# Patient Record
Sex: Female | Born: 1990 | Race: White | Hispanic: No | Marital: Single | Smoking: Never smoker
Health system: Southern US, Community
[De-identification: ages and names within clinical notes are randomized; demographics above are authoritative.]

## PROBLEM LIST (undated history)

## (undated) ENCOUNTER — Inpatient Hospital Stay (HOSPITAL_COMMUNITY): Payer: Self-pay

## (undated) DIAGNOSIS — B977 Papillomavirus as the cause of diseases classified elsewhere: Secondary | ICD-10-CM

## (undated) DIAGNOSIS — R87629 Unspecified abnormal cytological findings in specimens from vagina: Secondary | ICD-10-CM

## (undated) DIAGNOSIS — R87619 Unspecified abnormal cytological findings in specimens from cervix uteri: Secondary | ICD-10-CM

## (undated) DIAGNOSIS — F419 Anxiety disorder, unspecified: Secondary | ICD-10-CM

## (undated) HISTORY — DX: Papillomavirus as the cause of diseases classified elsewhere: B97.7

## (undated) HISTORY — DX: Unspecified abnormal cytological findings in specimens from cervix uteri: R87.619

## (undated) HISTORY — PX: WISDOM TOOTH EXTRACTION: SHX21

## (undated) HISTORY — DX: Unspecified abnormal cytological findings in specimens from vagina: R87.629

---
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
## 2012-06-06 ENCOUNTER — Encounter: Payer: Self-pay | Admitting: Internal Medicine

## 2012-06-06 ENCOUNTER — Ambulatory Visit (INDEPENDENT_AMBULATORY_CARE_PROVIDER_SITE_OTHER): Payer: BC Managed Care – HMO | Admitting: Internal Medicine

## 2012-06-06 VITALS — BP 110/80 | HR 96 | Temp 98.7°F | Resp 14 | Ht 64.5 in | Wt 124.2 lb

## 2012-06-06 DIAGNOSIS — Z832 Family history of diseases of the blood and blood-forming organs and certain disorders involving the immune mechanism: Secondary | ICD-10-CM

## 2012-06-06 DIAGNOSIS — Z0000 Encounter for general adult medical examination without abnormal findings: Secondary | ICD-10-CM

## 2012-06-06 MED ORDER — DESOGESTREL-ETHINYL ESTRADIOL 0.15-30 MG-MCG PO TABS: 1.0000 | ORAL_TABLET | Freq: Every day | ORAL | Status: DC

## 2012-06-06 NOTE — Progress Notes (Addendum)
Patient ID: Tiffany Bowman, female   DOB: Feb 23, 1991, 21 y.o.   MRN: [id number] Patient Active Problem List  Diagnosis  . Routine general medical examination at a health care facility    Subjective:  CC:   Chief Complaint  Patient presents with  . New Patient    HPI:   Tiffany Bowman is a 21 y.o. female who presents as a new patient to establish primary care with the chief complaint of  New patient.  upcoming sophomore at [employer] .  getting along famously with roommate.  Biggest issue was time management.  Getting lots of rest this summer .  Had the flu after [employer] break.  History of tonsillitis  But only 2 episodes this year.  On birth control,  For regulation of periods,   Not sexually active.  Had Gardasil vaccine.  Needs refill.     History reviewed. No pertinent past medical history.  History reviewed. No pertinent past surgical history.  Family History  Problem Relation Age of Onset  . Glaucoma Father   . Cancer Paternal Grandmother     breast cancer    History   Social History  . Marital Status: Single    Spouse Name: N/A    Number of Children: N/A  . Years of Education: N/A   Occupational History  . Archivist.   Social History Main Topics  . Smoking status: Never Smoker   . Smokeless tobacco: Never Used  . Alcohol Use: Yes  . Drug Use: No  . Sexually Active: No   Other Topics Concern  . Not on file   Social History Narrative  . [employer] upcoming sophomore in Pre Occupational Therapy         @ALLHX @    Review of Systems:   The remainder of the review of systems was negative except those addressed in the HPI.       Objective:  BP 110/80  Pulse 96  Temp 98.7 F (37.1 C) (Oral)  Resp 14  Ht 5' 4.5" (1.638 m)  Wt 124 lb 4 oz (56.359 kg)  BMI 21.00 kg/m2  SpO2 96%  LMP 05/23/2012  General appearance: alert, cooperative and appears stated age Ears: normal TM's and external ear canals both ears Throat: lips, mucosa, and  tongue normal; teeth and gums normal Neck: no adenopathy, no carotid bruit, supple, symmetrical, trachea midline and thyroid not enlarged, symmetric, no tenderness/mass/nodules Back: symmetric, no curvature. ROM normal. No CVA tenderness. Lungs: clear to auscultation bilaterally Heart: regular rate and rhythm, S1, S2 normal, no murmur, click, rub or gallop Abdomen: soft, non-tender; bowel sounds normal; no masses,  no organomegaly Pulses: 2+ and symmetric Skin: Skin color, texture, turgor normal. No rashes or lesions Lymph nodes: Cervical, supraclavicular, and axillary nodes normal.  Assessment and Plan:  No problem-specific assessment & plan notes found for this encounter.   Updated Medication List Outpatient Encounter Prescriptions as of 06/06/2012  Medication Sig Dispense Refill  . desogestrel-ethinyl estradiol (APRI,EMOQUETTE,SOLIA) 0.15-30 MG-MCG tablet Take 1 tablet by mouth daily.  1 Package  2  . Multiple Vitamin (MULTIVITAMIN) tablet Take 1 tablet by mouth daily.      Marland Kitchen DISCONTD: desogestrel-ethinyl estradiol (APRI,EMOQUETTE,SOLIA) 0.15-30 MG-MCG tablet Take 1 tablet by mouth daily.         No orders of the defined types were placed in this encounter.    No Follow-up on file.

## 2012-06-06 NOTE — Patient Instructions (Addendum)
Get your eyes checked

## 2012-06-16 ENCOUNTER — Telehealth: Payer: Self-pay | Admitting: Internal Medicine

## 2012-06-16 NOTE — Addendum Note (Signed)
Addended by: Duncan Dull on: 06/16/2012 01:16 PM   Modules accepted: Orders

## 2012-06-16 NOTE — Telephone Encounter (Signed)
Ordered

## 2012-06-16 NOTE — Telephone Encounter (Signed)
Patient was advised that she could come in for labs today, she says that she doesn't want to come in for the labs. She says that her mom was the one that was wanting this done, but patient refuses to do this right now. Mom wants me to call patient and express the importance of having this checked, but patient is 80 and we can not force patient to come in for labs if she doesn't want to.

## 2012-06-16 NOTE — Telephone Encounter (Signed)
Mother wants her checked for Von Willebrands' disease. He other daughter was diagnosed at her last visit with Dr. Dan Humphreys. Jasara will be leaving for College tomorrow and the mother wants her checked today.

## 2012-07-20 ENCOUNTER — Ambulatory Visit (INDEPENDENT_AMBULATORY_CARE_PROVIDER_SITE_OTHER): Payer: BC Managed Care – HMO | Admitting: Internal Medicine

## 2012-07-20 ENCOUNTER — Encounter: Payer: Self-pay | Admitting: Internal Medicine

## 2012-07-20 VITALS — BP 110/72 | HR 100 | Temp 98.1°F | Ht 64.5 in | Wt 125.5 lb

## 2012-07-20 DIAGNOSIS — Z832 Family history of diseases of the blood and blood-forming organs and certain disorders involving the immune mechanism: Secondary | ICD-10-CM

## 2012-07-20 DIAGNOSIS — S300XXA Contusion of lower back and pelvis, initial encounter: Secondary | ICD-10-CM

## 2012-07-20 DIAGNOSIS — M533 Sacrococcygeal disorders, not elsewhere classified: Secondary | ICD-10-CM

## 2012-07-20 MED ORDER — LEVONORGEST-ETH ESTRAD 91-DAY 0.15-0.03 &0.01 MG PO TABS: 1.0000 | ORAL_TABLET | Freq: Every day | ORAL | Status: DC

## 2012-07-20 MED ORDER — CELECOXIB 200 MG PO CAPS: 200.0000 mg | ORAL_CAPSULE | Freq: Two times a day (BID) | ORAL | Status: DC

## 2012-07-20 NOTE — Patient Instructions (Addendum)
I am presciribing celebrex for your tailbone,  You may take it once or twice daily and add tylenol to it up to 4 daily.   X rays of tailbone ordered.   Seasonique sent to [facility] in [place]

## 2012-07-20 NOTE — Progress Notes (Signed)
Patient ID: Tiffany Bowman, female   DOB: 05-May-1991, 21 y.o.   MRN: [id number] Patient Active Problem List  Diagnosis  . Routine general medical examination at a health care facility  . Coccyx contusion    Subjective:  CC:   Chief Complaint  Patient presents with  . Tailbone Pain    HPI:   Tiffany Bowman a 21 y.o. female who presents Coccygeal pain. The pain occurred after a fall. Patient fell over a month ago while engaging in recreational activity at [employer].  She landed square on her bottom.  she states that she had no immediate pain and spent the next 2 hours in recreational activities sliding down the rocks into the water. The next day she noticed some pain in her tailbone area brought on by standing up and sitting down. The pain is aggravated by changing position. It is worse when she first stands up. It is rated as a 6 or 7/10. She denies any numbness or tingling to her legs or sacrum. She has no history of bruises noted in that area. She has not taken any medication for it.    No past medical history on file.  No past surgical history on file.       The following portions of the patient's history were reviewed and updated as appropriate: Allergies, current medications, and problem list.    Review of Systems:   12 Pt  review of systems was negative except those addressed in the HPI,     History   Social History  . Marital Status: Single    Spouse Name: N/A    Number of Children: N/A  . Years of Education: N/A   Occupational History  . Not on file.   Social History Main Topics  . Smoking status: Never Smoker   . Smokeless tobacco: Never Used  . Alcohol Use: Yes  . Drug Use: No  . Sexually Active: Not on file   Other Topics Concern  . Not on file   Social History Narrative  . No narrative on file    Objective:  BP 110/72  Pulse 100  Temp 98.1 F (36.7 C) (Oral)  Ht 5' 4.5" (1.638 m)  Wt 125 lb 8 oz (56.926 kg)  BMI 21.21 kg/m2   SpO2 99%  LMP 07/13/2012  General appearance: alert, cooperative and appears stated age Ears: normal TM's and external ear canals both ears Throat: lips, mucosa, and tongue normal; teeth and gums normal Neck: no adenopathy, no carotid bruit, supple, symmetrical, trachea midline and thyroid not enlarged, symmetric, no tenderness/mass/nodules Back: symmetric, no curvature. ROM normal. No CVA tenderness. MSK: Hips, normal ROM,  No ischial tuberosity tenderness,. Lungs: clear to auscultation bilaterally Heart: regular rate and rhythm, S1, S2 normal, no murmur, click, rub or gallop Abdomen: soft, non-tender; bowel sounds normal; no masses,  no organomegaly Pulses: 2+ and symmetric Skin: Skin color, texture, turgor normal. No rashes or lesions Lymph nodes: Cervical, supraclavicular, and axillary nodes normal.  Assessment and Plan:  Coccyx contusion Suggested by history of fall with recurrent blunt trauma to the tailbone on during recreational activities it sliding rock. Plain films ordered. Nonsteroidal anti-inflammatories and pain relievers prescribed. Her symptoms have been present for a month but have not been treated. I explained to her this can be very difficult area to heal and may require an orthopedic evaluation for a cortisone or lidocaine injection. We will try conservative therapy first.    Updated Medication List Outpatient Encounter Prescriptions as  of 07/20/2012  Medication Sig Dispense Refill  . Multiple Vitamin (MULTIVITAMIN) tablet Take 1 tablet by mouth daily.      Marland Kitchen DISCONTD: desogestrel-ethinyl estradiol (APRI,EMOQUETTE,SOLIA) 0.15-30 MG-MCG tablet Take 1 tablet by mouth daily.  1 Package  2  . celecoxib (CELEBREX) 200 MG capsule Take 1 capsule (200 mg total) by mouth 2 (two) times daily.  30 capsule  2  . Levonorgestrel-Ethinyl Estradiol (AMETHIA,CAMRESE) 0.15-0.03 &0.01 MG tablet Take 1 tablet by mouth daily.  1 Package  11     Orders Placed This Encounter  Procedures    . Von Willebrand panel    No Follow-up on file.

## 2012-07-21 DIAGNOSIS — S300XXA Contusion of lower back and pelvis, initial encounter: Secondary | ICD-10-CM | POA: Insufficient documentation

## 2012-07-21 NOTE — Assessment & Plan Note (Addendum)
Suggested by history of fall with recurrent blunt trauma to the tailbone on during recreational activities it sliding rock. Plain films ordered. Nonsteroidal anti-inflammatories and pain relievers prescribed. Her symptoms have been present for a month but have not been treated. I explained to her this can be very difficult area to heal and may require an orthopedic evaluation for a cortisone or lidocaine injection. We will try conservative therapy first.

## 2012-07-22 ENCOUNTER — Ambulatory Visit: Payer: BC Managed Care – HMO | Admitting: Internal Medicine

## 2012-07-23 LAB — VON WILLEBRAND PANEL: Coagulation Factor VIII: 117 % (ref 73–140)

## 2012-12-20 ENCOUNTER — Ambulatory Visit: Payer: BC Managed Care – HMO | Admitting: Internal Medicine

## 2012-12-27 ENCOUNTER — Ambulatory Visit: Payer: BC Managed Care – HMO | Admitting: Internal Medicine

## 2013-09-02 ENCOUNTER — Encounter (HOSPITAL_COMMUNITY): Payer: Self-pay | Admitting: Emergency Medicine

## 2013-09-02 ENCOUNTER — Emergency Department (HOSPITAL_COMMUNITY)
Admission: EM | Admit: 2013-09-02 | Discharge: 2013-09-02 | Disposition: A | Discharge status: Home / Self Care | Payer: BC Managed Care – PPO | Attending: Emergency Medicine | Admitting: Emergency Medicine

## 2013-09-02 DIAGNOSIS — IMO0002 Reserved for concepts with insufficient information to code with codable children: Secondary | ICD-10-CM

## 2013-09-02 DIAGNOSIS — R109 Unspecified abdominal pain: Secondary | ICD-10-CM | POA: Insufficient documentation

## 2013-09-02 DIAGNOSIS — F1010 Alcohol abuse, uncomplicated: Secondary | ICD-10-CM | POA: Insufficient documentation

## 2013-09-02 DIAGNOSIS — Z79899 Other long term (current) drug therapy: Secondary | ICD-10-CM | POA: Insufficient documentation

## 2013-09-02 DIAGNOSIS — Z880 Allergy status to penicillin: Secondary | ICD-10-CM | POA: Insufficient documentation

## 2013-09-02 DIAGNOSIS — R231 Pallor: Secondary | ICD-10-CM | POA: Insufficient documentation

## 2013-09-02 DIAGNOSIS — F411 Generalized anxiety disorder: Secondary | ICD-10-CM | POA: Insufficient documentation

## 2013-09-02 DIAGNOSIS — R112 Nausea with vomiting, unspecified: Secondary | ICD-10-CM | POA: Insufficient documentation

## 2013-09-02 HISTORY — DX: Anxiety disorder, unspecified: F41.9

## 2013-09-02 MED ORDER — SODIUM CHLORIDE 0.9 % IV BOLUS (SEPSIS)
1000.0000 mL | Freq: Once | INTRAVENOUS | Status: AC
  Administered 2013-09-02: 1000 mL via INTRAVENOUS

## 2013-09-02 MED ORDER — ONDANSETRON HCL 4 MG/2ML IJ SOLN
4.0000 mg | Freq: Once | INTRAMUSCULAR | Status: AC
  Administered 2013-09-02: 4 mg via INTRAVENOUS
  Filled 2013-09-02: qty 2
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
## 2013-09-02 NOTE — ED Provider Notes (Signed)
Medical screening examination/treatment/procedure(s) were performed by non-physician practitioner and as supervising physician I was immediately available for consultation/collaboration.  EKG Interpretation   None         William Jillyan Plitt, MD 09/02/13 0703 

## 2014-01-17 ENCOUNTER — Encounter: Payer: Self-pay | Admitting: Adult Health

## 2014-01-17 ENCOUNTER — Encounter (INDEPENDENT_AMBULATORY_CARE_PROVIDER_SITE_OTHER): Payer: Self-pay

## 2014-01-17 ENCOUNTER — Ambulatory Visit (INDEPENDENT_AMBULATORY_CARE_PROVIDER_SITE_OTHER): Payer: BC Managed Care – HMO | Admitting: Adult Health

## 2014-01-17 VITALS — BP 118/78 | HR 95 | Temp 98.2°F | Resp 14 | Wt 137.0 lb

## 2014-01-17 DIAGNOSIS — Z7251 High risk heterosexual behavior: Secondary | ICD-10-CM | POA: Insufficient documentation

## 2014-01-17 DIAGNOSIS — L0390 Cellulitis, unspecified: Secondary | ICD-10-CM

## 2014-01-17 DIAGNOSIS — L0291 Cutaneous abscess, unspecified: Secondary | ICD-10-CM | POA: Insufficient documentation

## 2014-01-17 DIAGNOSIS — Z113 Encounter for screening for infections with a predominantly sexual mode of transmission: Secondary | ICD-10-CM

## 2014-01-17 MED ORDER — DOXYCYCLINE HYCLATE 100 MG PO TABS: 100.0000 mg | ORAL_TABLET | Freq: Two times a day (BID) | ORAL | Status: DC

## 2014-01-17 NOTE — Patient Instructions (Signed)
  Start Doxycycline 100 mg twice a day for 10 days. Finish all antibiotic.  Apply warm compresses to the affected area. You can also sit in warm water. Do this 3-4 times a day. If the area does not improve after 1 week then I will refer you to have it drained.  I will call you once I get the results of your test.

## 2014-01-17 NOTE — Addendum Note (Signed)
Addended by: Montine CircleMALDONADO, Nakia Koble D on: 01/17/2014 03:22 PM   Modules accepted: Orders
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
## 2014-01-17 NOTE — Progress Notes (Signed)
Pre visit review using our clinic review tool, if applicable. No additional management support is needed unless otherwise documented below in the visit note. 

## 2014-01-18 LAB — HIV ANTIBODY (ROUTINE TESTING W REFLEX): HIV: NONREACTIVE

## 2014-01-18 LAB — RPR

## 2014-01-18 LAB — HSV(HERPES SIMPLEX VRS) I + II AB-IGG: HSV 1 Glycoprotein G Ab, IgG: <0.1 IV

## 2014-01-18 LAB — PREGNANCY, URINE: Preg Test, Ur: NEGATIVE

## 2014-01-19 LAB — GC/CHLAMYDIA PROBE AMP
CT Probe RNA: NEGATIVE
GC Probe RNA: NEGATIVE

## 2014-02-16 ENCOUNTER — Encounter: Payer: BC Managed Care – PPO | Admitting: Internal Medicine

## 2014-03-23 ENCOUNTER — Encounter: Payer: Self-pay | Admitting: Internal Medicine

## 2014-03-23 ENCOUNTER — Ambulatory Visit (INDEPENDENT_AMBULATORY_CARE_PROVIDER_SITE_OTHER): Payer: BC Managed Care – PPO | Admitting: Internal Medicine

## 2014-03-23 ENCOUNTER — Other Ambulatory Visit (HOSPITAL_COMMUNITY)
Admission: RE | Admit: 2014-03-23 | Discharge: 2014-03-23 | Disposition: A | Discharge status: Home / Self Care | Payer: BC Managed Care – PPO | Source: Ambulatory Visit | Attending: Internal Medicine | Admitting: Internal Medicine

## 2014-03-23 ENCOUNTER — Encounter (INDEPENDENT_AMBULATORY_CARE_PROVIDER_SITE_OTHER): Payer: Self-pay

## 2014-03-23 VITALS — BP 135/92 | HR 100 | Temp 98.3°F | Ht 66.0 in | Wt 130.0 lb

## 2014-03-23 DIAGNOSIS — R87810 Cervical high risk human papillomavirus (HPV) DNA test positive: Secondary | ICD-10-CM | POA: Insufficient documentation

## 2014-03-23 DIAGNOSIS — R87622 Low grade squamous intraepithelial lesion on cytologic smear of vagina (LGSIL): Secondary | ICD-10-CM

## 2014-03-23 DIAGNOSIS — A630 Anogenital (venereal) warts: Secondary | ICD-10-CM

## 2014-03-23 DIAGNOSIS — Z124 Encounter for screening for malignant neoplasm of cervix: Secondary | ICD-10-CM

## 2014-03-23 DIAGNOSIS — Z1151 Encounter for screening for human papillomavirus (HPV): Secondary | ICD-10-CM | POA: Insufficient documentation

## 2014-03-23 NOTE — Progress Notes (Signed)
Pre visit review using our clinic review tool, if applicable. No additional management support is needed unless otherwise documented below in the visit note. 

## 2014-03-23 NOTE — Progress Notes (Signed)
Patient ID: Tiffany Bowman, female   DOB: Apr 29, 1991, 23 y.o.   MRN: [id number]   Patient Active Problem List   Diagnosis Date Noted  . Warts, genital 03/25/2014  . Pap smear for cervical cancer screening 03/25/2014  . Screen for STD (sexually transmitted disease) 01/17/2014  . Unprotected sex 01/17/2014  . Abscess of skin 01/17/2014  . Coccyx contusion 07/21/2012  . Routine general medical examination at a health care facility 06/06/2012    Subjective:  CC:   Chief Complaint  Patient presents with  . Vaginal Itching    Begin itching on last Sunday, went to see the student health nurse and they told her she has genital warts and positive for HPV would like confirmation.     HPI:   Tiffany Bowman is a 23 y.o. female who presents for Evaluation for recent episode of genital warts.  She has been treated by [facility] after a GYN evaluation was sought for vaginal itching and told she had genital warts.  She has been distraught with the news but has told her boyfriend. Had the gardasil vaccine as an adolescent.    Past Medical History  Diagnosis Date  . Anxiety     No past surgical history on file.     The following portions of the patient's history were reviewed and updated as appropriate: Allergies, current medications, and problem list.    Review of Systems:   Patient denies headache, fevers, malaise, unintentional weight loss, skin rash, eye pain, sinus congestion and sinus pain, sore throat, dysphagia,  hemoptysis , cough, dyspnea, wheezing, chest pain, palpitations, orthopnea, edema, abdominal pain, nausea, melena, diarrhea, constipation, flank pain, dysuria, hematuria, urinary  Frequency, nocturia, numbness, tingling, seizures,  Focal weakness, Loss of consciousness,  Tremor, insomnia, depression, anxiety, and suicidal ideation.     History   Social History  . Marital Status: Single    Spouse Name: N/A    Number of Children: N/A  . Years of Education: N/A    Occupational History  . Not on file.   Social History Main Topics  . Smoking status: Never Smoker   . Smokeless tobacco: Never Used  . Alcohol Use: Yes     Comment: every weekend  . Drug Use: No  . Sexual Activity: Not on file   Other Topics Concern  . Not on file   Social History Narrative  . No narrative on file    Objective:  Filed Vitals:   03/23/14 1422  BP: 135/92  Pulse: 100  Temp: 98.3 F (36.8 C)    General Appearance:    Alert, cooperative, no distress, appears stated age  Head:    Normocephalic, without obvious abnormality, atraumatic  Eyes:    PERRL, conjunctiva/corneas clear, EOM's intact, fundi    benign, both eyes  Ears:    Normal TM's and external ear canals, both ears  Nose:   Nares normal, septum midline, mucosa normal, no drainage    or sinus tenderness  Throat:   Lips, mucosa, and tongue normal; teeth and gums normal  Abdomen:     Soft, non-tender, bowel sounds active all four quadrants,    no masses, no organomegaly  Genitalia:    Pelvic: cervix normal in appearance, external genitalia normal, no adnexal masses or tenderness, no cervical motion tenderness, rectovaginal septum normal, uterus normal size, shape, and consistency and vagina normal without discharge  Extremities:   Extremities normal, atraumatic, no cyanosis or edema  Pulses:   2+ and symmetric all extremities  Skin:   Skin color, texture, turgor normal, no rashes or lesions  Lymph nodes:   Cervical, supraclavicular, and axillary nodes normal  Neurologic:   CNII-XII intact, normal strength, sensation and reflexes    throughout    Assessment and Plan:  Warts, genital S/p topical treatment by [facility] last week.  Long discussion with patient about mode of transmission , etc.info given.   Pap smear for cervical cancer screening PAP smear was done today/.    Updated Medication List Outpatient Encounter Prescriptions as of 03/23/2014  Medication Sig  . ALPRAZolam (XANAX)  0.25 MG tablet Take 1 tablet by mouth daily as needed for anxiety.   Marland Kitchen doxycycline (VIBRA-TABS) 100 MG tablet Take 1 tablet (100 mg total) by mouth 2 (two) times daily.     No orders of the defined types were placed in this encounter.    No Follow-up on file.

## 2014-03-25 DIAGNOSIS — Z124 Encounter for screening for malignant neoplasm of cervix: Secondary | ICD-10-CM | POA: Insufficient documentation

## 2014-03-25 DIAGNOSIS — A630 Anogenital (venereal) warts: Secondary | ICD-10-CM | POA: Insufficient documentation

## 2014-03-25 NOTE — Assessment & Plan Note (Signed)
S/p topical treatment by [facility] last week.  Long discussion with patient about mode of transmission , etc.info given.

## 2014-03-25 NOTE — Assessment & Plan Note (Signed)
PAP smear was done today 

## 2014-03-31 DIAGNOSIS — R87622 Low grade squamous intraepithelial lesion on cytologic smear of vagina (LGSIL): Secondary | ICD-10-CM | POA: Insufficient documentation

## 2014-04-08 NOTE — Addendum Note (Signed)
Addended by: Sherlene Shams on: 04/08/2014 09:30 AM   Modules accepted: Orders

## 2014-04-18 ENCOUNTER — Ambulatory Visit (INDEPENDENT_AMBULATORY_CARE_PROVIDER_SITE_OTHER): Payer: BC Managed Care – PPO | Admitting: Internal Medicine

## 2014-04-18 ENCOUNTER — Encounter: Payer: Self-pay | Admitting: Internal Medicine

## 2014-04-18 ENCOUNTER — Other Ambulatory Visit: Payer: Self-pay | Admitting: *Deleted

## 2014-04-18 VITALS — BP 118/70 | HR 95 | Temp 98.4°F | Resp 16 | Ht 65.0 in | Wt 127.2 lb

## 2014-04-18 DIAGNOSIS — N912 Amenorrhea, unspecified: Secondary | ICD-10-CM

## 2014-04-18 DIAGNOSIS — Z640 Problems related to unwanted pregnancy: Secondary | ICD-10-CM

## 2014-04-18 DIAGNOSIS — E875 Hyperkalemia: Secondary | ICD-10-CM

## 2014-04-18 DIAGNOSIS — IMO0001 Reserved for inherently not codable concepts without codable children: Secondary | ICD-10-CM

## 2014-04-18 DIAGNOSIS — Z0000 Encounter for general adult medical examination without abnormal findings: Secondary | ICD-10-CM

## 2014-04-18 DIAGNOSIS — R87622 Low grade squamous intraepithelial lesion on cytologic smear of vagina (LGSIL): Secondary | ICD-10-CM

## 2014-04-18 DIAGNOSIS — Z309 Encounter for contraceptive management, unspecified: Secondary | ICD-10-CM

## 2014-04-18 LAB — POCT URINE PREGNANCY: Preg Test, Ur: POSITIVE

## 2014-04-18 NOTE — Progress Notes (Signed)
Patient ID: Tiffany Bowman, female   DOB: 1991/03/05, 23 y.o.   MRN: [id number]   Patient Active Problem List   Diagnosis Date Noted  . Unplanned unwanted pregnancy 04/18/2014  . 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 Warts, genital 03/25/2014  . Pap smear for cervical cancer screening 03/25/2014  . Screen for STD (sexually transmitted disease) 01/17/2014  . Unprotected sex 01/17/2014  . Abscess of skin 01/17/2014  . Coccyx contusion 07/21/2012  . Routine general medical examination at a health care facility 06/06/2012    Subjective:  CC:   Chief Complaint  Patient presents with  . Acute Visit    2 neg ptegnancy test 2 positive    HPI:   Tiffany Bowman is a 23 y.o. female who presents for   Past Medical History  Diagnosis Date  . Anxiety     History reviewed. No pertinent past surgical history.     The following portions of the patient's history were reviewed and updated as appropriate: Allergies, current medications, and problem list.    Review of Systems:   Patient denies headache, fevers, malaise, unintentional weight loss, skin rash, eye pain, sinus congestion and sinus pain, sore throat, dysphagia,  hemoptysis , cough, dyspnea, wheezing, chest pain, palpitations, orthopnea, edema, abdominal pain, nausea, melena, diarrhea, constipation, flank pain, dysuria, hematuria, urinary  Frequency, nocturia, numbness, tingling, seizures,  Focal weakness, Loss of consciousness,  Tremor, insomnia, depression, anxiety, and suicidal ideation.     History   Social History  . Marital Status: Single    Spouse Name: N/A    Number of Children: N/A  . Years of Education: N/A   Occupational History  . Not on file.   Social History Main Topics  . Smoking status: Never Smoker   . Smokeless tobacco: Never Used  . Alcohol Use: 4.0 oz/week    8 drink(s) per week     Comment: every weekend  . Drug Use: No  . Sexual Activity: Yes     Birth Control/ Protection: Coitus interruptus   Other Topics Concern  . Not on file   Social History Narrative  . No narrative on file    Objective:  Filed Vitals:   04/18/14 1456  BP: 118/70  Pulse: 95  Temp: 98.4 F (36.9 C)  Resp: 16     General appearance: alert, cooperative and appears stated age Ears: normal TM's and external ear canals both ears Throat: lips, mucosa, and tongue normal; teeth and gums normal Neck: no adenopathy, no carotid bruit, supple, symmetrical, trachea midline and thyroid not enlarged, symmetric, no tenderness/mass/nodules Back: symmetric, no curvature. ROM normal. No CVA tenderness. Lungs: clear to auscultation bilaterally Heart: regular rate and rhythm, S1, S2 normal, no murmur, click, rub or gallop Abdomen: soft, non-tender; bowel sounds normal; no masses,  no organomegaly Pulses: 2+ and symmetric Skin: Skin color, texture, turgor normal. No rashes or lesions Lymph nodes: Cervical, supraclavicular, and axillary nodes normal.  Assessment and Plan:  Unplanned unwanted pregnancy Ambiguous results per home tests and per office tests . But serum test confirms [redacted] week gestational pregnancy. She is quite upset and has enquired about "the pill for abortion."  She was given information about abortion with misoprostol and already has an appt with Dr . Cherly Hensenousins in 2 weeks for evaluation of an abnormal PAP smear .  Advised to start MVI with folic acid daily until she has a definitive action plan.   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 ALPRAZolam (XANAX) 0.25 MG tablet Take 1 tablet by mouth daily as needed for anxiety.   Marland Kitchen. doxycycline (VIBRA-TABS) 100 MG tablet Take 1 tablet (100 mg total) by mouth 2 (two) times daily.     Orders Placed This Encounter  Procedures  . Pregnancy, urine  . B-HCG Quant  . POCT urine pregnancy    No Follow-up on file.

## 2014-04-19 ENCOUNTER — Ambulatory Visit: Payer: BC Managed Care – PPO

## 2014-04-19 DIAGNOSIS — N912 Amenorrhea, unspecified: Secondary | ICD-10-CM

## 2014-04-19 LAB — PREGNANCY, URINE: PREG TEST UR: NEGATIVE

## 2014-04-20 LAB — HCG, QUANTITATIVE, PREGNANCY: hCG, Beta Chain, Quant, S: 91.1 m[IU]/mL

## 2014-04-21 ENCOUNTER — Encounter: Payer: Self-pay | Admitting: Internal Medicine

## 2014-04-21 NOTE — Assessment & Plan Note (Signed)
Recommended referral for colposcopy since the PAP mentioned the existence of several cells that were higher grade

## 2014-04-21 NOTE — Assessment & Plan Note (Signed)
Ambiguous results per home tests and per office tests . But serum test confirms [redacted] week gestational pregnancy. She is quite upset and has enquired about "the pill for abortion."  She was given information about abortion with misoprostol and already has an appt with Dr . Cherly Hensenousins in 2 weeks for evaluation of an abnormal PAP smear (LGSIL with additional abnormal cells and positive HPV).

## 2014-04-21 NOTE — Assessment & Plan Note (Signed)
She had a PAP smear last month because she has become sexually active

## 2014-07-23 ENCOUNTER — Ambulatory Visit: Payer: BC Managed Care – PPO | Admitting: Internal Medicine

## 2014-07-23 DIAGNOSIS — Z0289 Encounter for other administrative examinations: Secondary | ICD-10-CM

## 2014-08-13 ENCOUNTER — Ambulatory Visit: Payer: BC Managed Care – PPO | Admitting: Internal Medicine

## 2014-08-29 ENCOUNTER — Ambulatory Visit: Payer: BC Managed Care – PPO | Admitting: Internal Medicine

## 2014-08-29 DIAGNOSIS — Z0289 Encounter for other administrative examinations: Secondary | ICD-10-CM

## 2014-08-31 ENCOUNTER — Telehealth: Payer: Self-pay | Admitting: Internal Medicine

## 2014-08-31 NOTE — Telephone Encounter (Signed)
Ms. Tiffany Bowman called asking if the No Show fee she's being charged for the 07/23/14 appt can be waived. I asked Tiffany Bowman and she said to tell the pt we'd review the fee and someone will get back to her. I gave Ms. Tiffany Bowman this information.  Pt ph# [phone] Thank you.

## 2016-06-01 ENCOUNTER — Encounter: Payer: Self-pay | Admitting: Internal Medicine

## 2016-06-01 ENCOUNTER — Ambulatory Visit (INDEPENDENT_AMBULATORY_CARE_PROVIDER_SITE_OTHER): Payer: 59 | Admitting: Internal Medicine

## 2016-06-01 VITALS — BP 102/70 | HR 93 | Temp 98.4°F | Ht 65.25 in | Wt 148.1 lb

## 2016-06-01 DIAGNOSIS — R238 Other skin changes: Secondary | ICD-10-CM

## 2016-06-01 DIAGNOSIS — R233 Spontaneous ecchymoses: Secondary | ICD-10-CM

## 2016-06-01 DIAGNOSIS — E785 Hyperlipidemia, unspecified: Secondary | ICD-10-CM | POA: Diagnosis not present

## 2016-06-01 DIAGNOSIS — R5383 Other fatigue: Secondary | ICD-10-CM

## 2016-06-01 DIAGNOSIS — E559 Vitamin D deficiency, unspecified: Secondary | ICD-10-CM

## 2016-06-01 DIAGNOSIS — Z0000 Encounter for general adult medical examination without abnormal findings: Secondary | ICD-10-CM

## 2016-06-01 DIAGNOSIS — M5126 Other intervertebral disc displacement, lumbar region: Secondary | ICD-10-CM

## 2016-06-01 HISTORY — DX: Other intervertebral disc displacement, lumbar region: M51.26

## 2016-06-01 LAB — LIPID PANEL
CHOLESTEROL: 169 mg/dL (ref 0–200)
HDL: 52.7 mg/dL (ref >39.00)
LDL CALC: 105 mg/dL — AB (ref 0–99)
NonHDL: 116.75
TRIGLYCERIDES: 59 mg/dL (ref 0.0–149.0)
Total CHOL/HDL Ratio: 3
VLDL: 11.8 mg/dL (ref 0.0–40.0)

## 2016-06-01 LAB — CBC WITH DIFFERENTIAL/PLATELET
Basophils Absolute: 0 10*3/uL (ref 0.0–0.1)
Basophils Relative: 0.3 % (ref 0.0–3.0)
EOS ABS: 0.4 10*3/uL (ref 0.0–0.7)
Eosinophils Relative: 5.1 % — ABNORMAL HIGH (ref 0.0–5.0)
HCT: 41.6 % (ref 36.0–46.0)
Hemoglobin: 14 g/dL (ref 12.0–15.0)
LYMPHS PCT: 31.2 % (ref 12.0–46.0)
Lymphs Abs: 2.3 10*3/uL (ref 0.7–4.0)
MCHC: 33.7 g/dL (ref 30.0–36.0)
MCV: 90.2 fl (ref 78.0–100.0)
MONO ABS: 0.6 10*3/uL (ref 0.1–1.0)
Monocytes Relative: 8.1 % (ref 3.0–12.0)
Neutro Abs: 4.1 10*3/uL (ref 1.4–7.7)
Neutrophils Relative %: 55.3 % (ref 43.0–77.0)
Platelets: 232 10*3/uL (ref 150.0–400.0)
RBC: 4.61 Mil/uL (ref 3.87–5.11)
RDW: 12.6 % (ref 11.5–15.5)
WBC: 7.5 10*3/uL (ref 4.0–10.5)

## 2016-06-01 LAB — COMPREHENSIVE METABOLIC PANEL
ALT: 12 U/L (ref 0–35)
AST: 14 U/L (ref 0–37)
Albumin: 4.3 g/dL (ref 3.5–5.2)
Alkaline Phosphatase: 48 U/L (ref 39–117)
BUN: 11 mg/dL (ref 6–23)
CALCIUM: 9.5 mg/dL (ref 8.4–10.5)
CHLORIDE: 104 meq/L (ref 96–112)
CO2: 28 mEq/L (ref 19–32)
CREATININE: 0.67 mg/dL (ref 0.40–1.20)
GFR: 115.34 mL/min (ref >60.00)
Glucose, Bld: 96 mg/dL (ref 70–99)
Potassium: 4.6 mEq/L (ref 3.5–5.1)
Sodium: 139 mEq/L (ref 135–145)
Total Bilirubin: 0.4 mg/dL (ref 0.2–1.2)
Total Protein: 7.4 g/dL (ref 6.0–8.3)

## 2016-06-01 LAB — VITAMIN D 25 HYDROXY (VIT D DEFICIENCY, FRACTURES): VITD: 35.68 ng/mL (ref 30.00–100.00)

## 2016-06-01 LAB — TSH: TSH: 0.98 u[IU]/mL (ref 0.35–4.50)

## 2016-06-01 NOTE — Progress Notes (Signed)
Pre visit review using our clinic review tool, if applicable. No additional management support is needed unless otherwise documented below in the visit note. 

## 2016-06-01 NOTE — Progress Notes (Signed)
Patient ID: Tiffany Bowman, female    DOB: 01-11-1991  Age: 25 y.o. MRN: [id number]  The patient is here for annual preventive wellness examination and management of other chronic and acute problems.   History of Veneral warts and LGSIL managed by [facility] 2016 (referred there after PAP was abormal, HPV was positive) Had an elective abortion  in 2015 (HCG was positive at time of GYN referral ) Now using OCP Had PAP smear  in December 2016.  Normal , no warts . Now using Nexplanon    The risk factors are reflected in the social history.  The roster of all physicians providing medical care to patient - is listed in the Snapshot section of the chart.  Activities of daily living:  The patient is 100% independent in all ADLs: dressing, toileting, feeding as well as independent mobility  Home safety : The patient has smoke detectors in the home. They wear seatbelts.  There are no firearms at home. There is no violence in the home.   There is no risks for hepatitis, STDs or HIV. There is no   history of blood transfusion. They have no travel history to infectious disease endemic areas of the world.  The patient has seen their dentist in the last six month. They have seen their eye doctor in the last year. They admit to slight hearing difficulty with regard to whispered voices and some television programs.  They have deferred audiologic testing in the last year.  They do not  have excessive sun exposure. Discussed the need for sun protection: hats, long sleeves and use of sunscreen if there is significant sun exposure.   Diet: the importance of a healthy diet is discussed. They do have a healthy diet.  The benefits of regular aerobic exercise were discussed. She walks 4 times per week ,  20 minutes.   Depression screen: there are no signs or vegative symptoms of depression- irritability, change in appetite, anhedonia, sadness/tearfullness.  Cognitive assessment: the patient manages all their  financial and personal affairs and is actively engaged. They could relate day,date,year and events; recalled 2/3 objects at 3 minutes; performed clock-face test normally.  The following portions of the patient's history were reviewed and updated as appropriate: allergies, current medications, past family history, past medical history,  past surgical history, past social history  and problem list.  Visual acuity was not assessed per patient preference since she has regular follow up with her ophthalmologist. Hearing and body mass index were assessed and reviewed.   During the course of the visit the patient was educated and counseled about appropriate screening and preventive services including : fall prevention , diabetes screening, nutrition counseling, colorectal cancer screening, and recommended immunizations.    CC: The primary encounter diagnosis was Easy bruising. Diagnoses of Other fatigue, Hyperlipidemia, Vitamin D deficiency, and Routine general medical examination at a health care facility were also pertinent to this visit.  21 lb wt gain since June 2015.  BMI  Now 24.4   Left metatarsal fracture finally discovered by Precious Reel,  worea boot until 2 weeks AGO .   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 .  Degree in recreational therapy ,  Planning  to return in a year for master's degree in OT at [employer]     History Tiffany Bowman has a past  medical history of Anxiety.   She has no past surgical history on file.   Her family history includes Cancer in her paternal grandmother; Glaucoma in her father.She reports that she has never smoked. She has never used smokeless tobacco. She reports that she drinks about 4.0 oz of alcohol per week . She reports that she does not use drugs.  Outpatient Medications Prior to Visit  Medication Sig Dispense Refill  . ALPRAZolam (XANAX) 0.25 MG tablet Take 1 tablet by  mouth daily as needed for anxiety.     Marland Kitchen doxycycline (VIBRA-TABS) 100 MG tablet Take 1 tablet (100 mg total) by mouth 2 (two) times daily. 20 tablet 0   No facility-administered medications prior to visit.     Review of Systems   Patient denies headache, fevers, malaise, unintentional weight loss, skin rash, eye pain, sinus congestion and sinus pain, sore throat, dysphagia,  hemoptysis , cough, dyspnea, wheezing, chest pain, palpitations, orthopnea, edema, abdominal pain, nausea, melena, diarrhea, constipation, flank pain, dysuria, hematuria, urinary  Frequency, nocturia, numbness, tingling, seizures,  Focal weakness, Loss of consciousness,  Tremor, insomnia, depression, anxiety, and suicidal ideation.      Objective:  BP 102/70   Pulse 93   Temp 98.4 F (36.9 C) (Oral)   Ht 5' 5.25" (1.657 m)   Wt 148 lb 2 oz (67.2 kg)   LMP 05/18/2016 (Within Days)   SpO2 98%   BMI 24.46 kg/m   Physical Exam  General appearance: alert, cooperative and appears stated age Ears: normal TM's and external ear canals both ears Throat: lips, mucosa, and tongue normal; teeth and gums normal Neck: no adenopathy, no carotid bruit, supple, symmetrical, trachea midline and thyroid not enlarged, symmetric, no tenderness/mass/nodules Back: symmetric, no curvature. ROM normal. No CVA tenderness. Lungs: clear to auscultation bilaterally Heart: regular rate and rhythm, S1, S2 normal, no murmur, click, rub or gallop Abdomen: soft, non-tender; bowel sounds normal; no masses,  no organomegaly Pulses: 2+ and symmetric Skin: Skin color, texture, turgor normal. No rashes or lesions Lymph nodes: Cervical, supraclavicular, and axillary nodes normal.   Assessment & Plan:   Problem List Items Addressed This Visit    Routine general medical examination at a health care facility    Annual comprehensive preventive exam was done as well as an evaluation and management of chronic conditions .  During the course of the  visit the patient was educated and counseled about appropriate screening and preventive services including :  diabetes screening, lipid analysis with projected  10 year  risk for CAD , nutrition counseling, breast, cervical and colorectal cancer screening, and recommended immunizations.  Printed recommendations for health maintenance screenings was given       Other Visit Diagnoses    Easy bruising    -  Primary   Relevant Orders   CBC with Differential/Platelet (Completed)   Other fatigue       Relevant Orders   Comprehensive metabolic panel (Completed)   TSH (Completed)   Hyperlipidemia       Relevant Orders   Lipid panel (Completed)   Vitamin D deficiency       Relevant Orders   VITAMIN D 25 Hydroxy (Vit-D Deficiency, Fractures) (Completed)      I have discontinued Ms. Hazelrigg's ALPRAZolam and doxycycline.  No orders of the defined types were placed in this encounter.   Medications Discontinued During This Encounter  Medication Reason  . doxycycline (VIBRA-TABS) 100 MG tablet Completed Course  . ALPRAZolam (XANAX) 0.25 MG  tablet Patient Preference    Follow-up: No Follow-up on file.   Crecencio Mc, MD

## 2016-06-01 NOTE — Patient Instructions (Signed)
Health Maintenance, Female Adopting a healthy lifestyle and getting preventive care can go a long way to promote health and wellness. Talk with your health care provider about what schedule of regular examinations is right for you. This is a good chance for you to check in with your provider about disease prevention and staying healthy. In between checkups, there are plenty of things you can do on your own. Experts have done a lot of research about which lifestyle changes and preventive measures are most likely to keep you healthy. Ask your health care provider for more information. WEIGHT AND DIET  Eat a healthy diet  Be sure to include plenty of vegetables, fruits, low-fat dairy products, and lean protein.  Do not eat a lot of foods high in solid fats, added sugars, or salt.  Get regular exercise. This is one of the most important things you can do for your health.  Most adults should exercise for at least 150 minutes each week. The exercise should increase your heart rate and make you sweat (moderate-intensity exercise).  Most adults should also do strengthening exercises at least twice a week. This is in addition to the moderate-intensity exercise.  Maintain a healthy weight  Body mass index (BMI) is a measurement that can be used to identify possible weight problems. It estimates body fat based on height and weight. Your health care provider can help determine your BMI and help you achieve or maintain a healthy weight.  For females 20 years of age and older:   A BMI below 18.5 is considered underweight.  A BMI of 18.5 to 24.9 is normal.  A BMI of 25 to 29.9 is considered overweight.  A BMI of 30 and above is considered obese.  Watch levels of cholesterol and blood lipids  You should start having your blood tested for lipids and cholesterol at 24 years of age, then have this test every 5 years.  You may need to have your cholesterol levels checked more often if:  Your lipid  or cholesterol levels are high.  You are older than 24 years of age.  You are at high risk for heart disease.  CANCER SCREENING   Lung Cancer  Lung cancer screening is recommended for adults 55-80 years old who are at high risk for lung cancer because of a history of smoking.  A yearly low-dose CT scan of the lungs is recommended for people who:  Currently smoke.  Have quit within the past 15 years.  Have at least a 30-pack-year history of smoking. A pack year is smoking an average of one pack of cigarettes a day for 1 year.  Yearly screening should continue until it has been 15 years since you quit.  Yearly screening should stop if you develop a health problem that would prevent you from having lung cancer treatment.  Breast Cancer  Practice breast self-awareness. This means understanding how your breasts normally appear and feel.  It also means doing regular breast self-exams. Let your health care provider know about any changes, no matter how small.  If you are in your 20s or 30s, you should have a clinical breast exam (CBE) by a health care provider every 1-3 years as part of a regular health exam.  If you are 40 or older, have a CBE every year. Also consider having a breast X-ray (mammogram) every year.  If you have a family history of breast cancer, talk to your health care provider about genetic screening.  If you   are at high risk for breast cancer, talk to your health care provider about having an MRI and a mammogram every year.  Breast cancer gene (BRCA) assessment is recommended for women who have family members with BRCA-related cancers. BRCA-related cancers include:  Breast.  Ovarian.  Tubal.  Peritoneal cancers.  Results of the assessment will determine the need for genetic counseling and BRCA1 and BRCA2 testing. Cervical Cancer Your health care provider may recommend that you be screened regularly for cancer of the pelvic organs (ovaries, uterus, and  vagina). This screening involves a pelvic examination, including checking for microscopic changes to the surface of your cervix (Pap test). You may be encouraged to have this screening done every 3 years, beginning at age 21.  For women ages 30-65, health care providers may recommend pelvic exams and Pap testing every 3 years, or they may recommend the Pap and pelvic exam, combined with testing for human papilloma virus (HPV), every 5 years. Some types of HPV increase your risk of cervical cancer. Testing for HPV may also be done on women of any age with unclear Pap test results.  Other health care providers may not recommend any screening for nonpregnant women who are considered low risk for pelvic cancer and who do not have symptoms. Ask your health care provider if a screening pelvic exam is right for you.  If you have had past treatment for cervical cancer or a condition that could lead to cancer, you need Pap tests and screening for cancer for at least 20 years after your treatment. If Pap tests have been discontinued, your risk factors (such as having a new sexual partner) need to be reassessed to determine if screening should resume. Some women have medical problems that increase the chance of getting cervical cancer. In these cases, your health care provider may recommend more frequent screening and Pap tests. Colorectal Cancer  This type of cancer can be detected and often prevented.  Routine colorectal cancer screening usually begins at 24 years of age and continues through 24 years of age.  Your health care provider may recommend screening at an earlier age if you have risk factors for colon cancer.  Your health care provider may also recommend using home test kits to check for hidden blood in the stool.  A small camera at the end of a tube can be used to examine your colon directly (sigmoidoscopy or colonoscopy). This is done to check for the earliest forms of colorectal  cancer.  Routine screening usually begins at age 50.  Direct examination of the colon should be repeated every 5-10 years through 24 years of age. However, you may need to be screened more often if early forms of precancerous polyps or small growths are found. Skin Cancer  Check your skin from head to toe regularly.  Tell your health care provider about any new moles or changes in moles, especially if there is a change in a mole's shape or color.  Also tell your health care provider if you have a mole that is larger than the size of a pencil eraser.  Always use sunscreen. Apply sunscreen liberally and repeatedly throughout the day.  Protect yourself by wearing long sleeves, pants, a wide-brimmed hat, and sunglasses whenever you are outside. HEART DISEASE, DIABETES, AND HIGH BLOOD PRESSURE   High blood pressure causes heart disease and increases the risk of stroke. High blood pressure is more likely to develop in:  People who have blood pressure in the high end   of the normal range (130-139/85-89 mm Hg).  People who are overweight or obese.  People who are African American.  If you are 38-23 years of age, have your blood pressure checked every 3-5 years. If you are 61 years of age or older, have your blood pressure checked every year. You should have your blood pressure measured twice--once when you are at a hospital or clinic, and once when you are not at a hospital or clinic. Record the average of the two measurements. To check your blood pressure when you are not at a hospital or clinic, you can use:  An automated blood pressure machine at a pharmacy.  A home blood pressure monitor.  If you are between 45 years and 39 years old, ask your health care provider if you should take aspirin to prevent strokes.  Have regular diabetes screenings. This involves taking a blood sample to check your fasting blood sugar level.  If you are at a normal weight and have a low risk for diabetes,  have this test once every three years after 24 years of age.  If you are overweight and have a high risk for diabetes, consider being tested at a younger age or more often. PREVENTING INFECTION  Hepatitis B  If you have a higher risk for hepatitis B, you should be screened for this virus. You are considered at high risk for hepatitis B if:  You were born in a country where hepatitis B is common. Ask your health care provider which countries are considered high risk.  Your parents were born in a high-risk country, and you have not been immunized against hepatitis B (hepatitis B vaccine).  You have HIV or AIDS.  You use needles to inject street drugs.  You live with someone who has hepatitis B.  You have had sex with someone who has hepatitis B.  You get hemodialysis treatment.  You take certain medicines for conditions, including cancer, organ transplantation, and autoimmune conditions. Hepatitis C  Blood testing is recommended for:  Everyone born from [birth year] through 1964.  Anyone with known risk factors for hepatitis C. Sexually transmitted infections (STIs)  You should be screened for sexually transmitted infections (STIs) including gonorrhea and chlamydia if:  You are sexually active and are younger than 24 years of age.  You are older than 24 years of age and your health care provider tells you that you are at risk for this type of infection.  Your sexual activity has changed since you were last screened and you are at an increased risk for chlamydia or gonorrhea. Ask your health care provider if you are at risk.  If you do not have HIV, but are at risk, it may be recommended that you take a prescription medicine daily to prevent HIV infection. This is called pre-exposure prophylaxis (PrEP). You are considered at risk if:  You are sexually active and do not regularly use condoms or know the HIV status of your partner(s).  You take drugs by injection.  You are sexually  active with a partner who has HIV. Talk with your health care provider about whether you are at high risk of being infected with HIV. If you choose to begin PrEP, you should first be tested for HIV. You should then be tested every 3 months for as long as you are taking PrEP.  PREGNANCY   If you are premenopausal and you may become pregnant, ask your health care provider about preconception counseling.  If you may  become pregnant, take 400 to 800 micrograms (mcg) of folic acid every day.  If you want to prevent pregnancy, talk to your health care provider about birth control (contraception). OSTEOPOROSIS AND MENOPAUSE   Osteoporosis is a disease in which the bones lose minerals and strength with aging. This can result in serious bone fractures. Your risk for osteoporosis can be identified using a bone density scan.  If you are 61 years of age or older, or if you are at risk for osteoporosis and fractures, ask your health care provider if you should be screened.  Ask your health care provider whether you should take a calcium or vitamin D supplement to lower your risk for osteoporosis.  Menopause may have certain physical symptoms and risks.  Hormone replacement therapy may reduce some of these symptoms and risks. Talk to your health care provider about whether hormone replacement therapy is right for you.  HOME CARE INSTRUCTIONS   Schedule regular health, dental, and eye exams.  Stay current with your immunizations.   Do not use any tobacco products including cigarettes, chewing tobacco, or electronic cigarettes.  If you are pregnant, do not drink alcohol.  If you are breastfeeding, limit how much and how often you drink alcohol.  Limit alcohol intake to no more than 1 drink per day for nonpregnant women. One drink equals 12 ounces of beer, 5 ounces of wine, or 1 ounces of hard liquor.  Do not use street drugs.  Do not share needles.  Ask your health care provider for help if  you need support or information about quitting drugs.  Tell your health care provider if you often feel depressed.  Tell your health care provider if you have ever been abused or do not feel safe at home.   This information is not intended to replace advice given to you by your health care provider. Make sure you discuss any questions you have with your health care provider.   Document Released: 05/04/2011 Document Revised: 11/09/2014 Document Reviewed: 09/20/2013 Elsevier Interactive Patient Education [employer].

## 2016-06-02 NOTE — Assessment & Plan Note (Signed)
Annual comprehensive preventive exam was done as well as an evaluation and management of chronic conditions .  During the course of the visit the patient was educated and counseled about appropriate screening and preventive services including :  diabetes screening, lipid analysis with projected  10 year  risk for CAD , nutrition counseling, breast, cervical and colorectal cancer screening, and recommended immunizations.  Printed recommendations for health maintenance screenings was given 

## 2016-06-04 ENCOUNTER — Encounter: Payer: Self-pay | Admitting: *Deleted

## 2016-10-14 ENCOUNTER — Encounter (HOSPITAL_COMMUNITY): Payer: Self-pay

## 2016-10-14 ENCOUNTER — Emergency Department (HOSPITAL_COMMUNITY)
Admission: EM | Admit: 2016-10-14 | Discharge: 2016-10-15 | Disposition: A | Discharge status: Home / Self Care | Payer: 59 | Attending: Emergency Medicine | Admitting: Emergency Medicine

## 2016-10-14 DIAGNOSIS — R079 Chest pain, unspecified: Secondary | ICD-10-CM

## 2016-10-14 DIAGNOSIS — R071 Chest pain on breathing: Secondary | ICD-10-CM | POA: Insufficient documentation

## 2016-10-14 LAB — CBC
HEMATOCRIT: 41.1 % (ref 36.0–46.0)
HEMOGLOBIN: 14.4 g/dL (ref 12.0–15.0)
MCH: 30.6 pg (ref 26.0–34.0)
MCHC: 35 g/dL (ref 30.0–36.0)
MCV: 87.4 fL (ref 78.0–100.0)
Platelets: 253 10*3/uL (ref 150–400)
RBC: 4.7 MIL/uL (ref 3.87–5.11)
RDW: 11.7 % (ref 11.5–15.5)
WBC: 9.5 10*3/uL (ref 4.0–10.5)

## 2016-10-14 LAB — I-STAT TROPONIN, ED: Troponin i, poc: 0 ng/mL (ref 0.00–0.08)

## 2016-10-14 LAB — BASIC METABOLIC PANEL
ANION GAP: 9 (ref 5–15)
BUN: 13 mg/dL (ref 6–20)
CO2: 26 mmol/L (ref 22–32)
Calcium: 9.3 mg/dL (ref 8.9–10.3)
Chloride: 102 mmol/L (ref 101–111)
Creatinine, Ser: 0.71 mg/dL (ref 0.44–1.00)
GFR calc Af Amer: >60 mL/min (ref >60)
GFR calc non Af Amer: >60 mL/min (ref >60)
GLUCOSE: 100 mg/dL — AB (ref 65–99)
POTASSIUM: 3.6 mmol/L (ref 3.5–5.1)
Sodium: 137 mmol/L (ref 135–145)

## 2016-10-14 LAB — D-DIMER, QUANTITATIVE (NOT AT ARMC)

## 2016-10-14 LAB — I-STAT BETA HCG BLOOD, ED (MC, WL, AP ONLY): I-stat hCG, quantitative: <5 m[IU]/mL (ref <5)

## 2016-10-14 NOTE — ED Triage Notes (Signed)
PT STS SHE WOKE UP LAST NIGHT WITH STABBING PAIN TO THE LEFT RIBS WITH SOB. PT STS THE PAIN WAS STILL THERE TODAY, SO SHE WENT TO AN [facility],AND AN EKG AND CHEST XRAY WERE PERFORMED. SHE WAS TOLD TO COME HERE FOR FURTHER EVALUATION. PT STS HER PAIN RIGHT NOW IS 3/10, WITH A RUNNY NOSE AND SCRATCHY THROAT.

## 2016-10-14 NOTE — ED Notes (Signed)
THE MOTHER IS NOW STATING THAT THEY WERE SENT BY [facility] TO BE EVALUATED TO R/O A PE. PT STS SHE HAS BEEN ON FOUR DIFFERENT BIRTH CONTROL, AND THAT SHE DOESN'T WANT TO LEAVE FROM HERE WITHOUT KNOWING IF SOMETHING ELSE IS WRONG SINCE HER EKG AND CHEST XRAY WERE NORMAL.

## 2016-10-15 MED ORDER — IBUPROFEN 200 MG PO TABS
600.0000 mg | ORAL_TABLET | Freq: Once | ORAL | Status: AC
  Administered 2016-10-15: 600 mg via ORAL
  Filled 2016-10-15: qty 3

## 2016-10-15 NOTE — ED Notes (Signed)
Dr Liu at [facility]

## 2016-10-15 NOTE — ED Provider Notes (Signed)
[facility]-EMERGENCY DEPT Provider Note   CSN: [id number] Arrival date & time: 10/14/16  1914     History   Chief Complaint Chief Complaint  Patient presents with  . Chest Pain    HPI Jonette MateKendall M Schlegel is a 25 y.o. female.  HPI 25 year old female who present with chest pain. She is otherwise healthy, and takes OCPs. States that yesterday at 5 AM developed sharp left-sided chest pain that was worse with deep inspiration and worse with some positional movements. Initially thought that it was gas related and took Gas-X without improvement. Pain has been waxing and waning throughout the day today, and it was not associated with any shortness of breath, syncope or near syncope. She has had scratchy throat and runny nose but no significant cough or sputum production. No fevers or chills. No lower extremity edema or calf tenderness. No prior PE/DVT, recent immobilization, or family history of PE/DVT. Initially was taken to urgent care by her mother where she had normal EKG and a normal chest x-ray. Was subsequently sent to the ED for evaluation of potentialPE.   Past Medical History:  Diagnosis Date  . Anxiety     Patient Active Problem List   Diagnosis Date Noted  . Lumbar herniated disc 06/01/2016  . Unplanned unwanted pregnancy 04/18/2014  . 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 LGSIL Pap smear of vagina 03/31/2014  . Venereal wart 03/25/2014  . Pap smear for cervical cancer screening 03/25/2014  . Screen for STD (sexually transmitted disease) 01/17/2014  . Unprotected sex 01/17/2014  . High risk sexual behavior 01/17/2014  . Routine general medical examination at a health care facility 06/06/2012    History reviewed. No pertinent surgical history.  OB History    No data available       Home Medications    Prior to Admission medications   Not on File    Family History Family History  Problem Relation Age of Onset  .  Glaucoma Father   . Cancer Paternal Grandmother     breast cancer    Social History Social History  Substance Use Topics  . Smoking status: Never Smoker  . Smokeless tobacco: Never Used  . Alcohol use 4.0 oz/week    8 Standard drinks or equivalent per week     Comment: every weekend     Allergies   Azithromycin and Penicillins   Review of Systems Review of Systems 10/14 systems reviewed and are negative other than those stated in the HPI   Physical Exam Updated Vital Signs BP 134/88 (BP Location: Left Arm)   Pulse 85   Temp 99 F (37.2 C) (Oral)   Resp 18   Ht 5\' 5"  (1.651 m)   Wt 145 lb (65.8 kg)   LMP 10/05/2016   SpO2 100%   BMI 24.13 kg/m   Physical Exam Physical Exam  Nursing note and vitals reviewed. Constitutional: Well developed, well nourished, non-toxic, and in no acute distress Head: Normocephalic and atraumatic.  Mouth/Throat: Oropharynx is clear and moist.  Neck: Normal range of motion. Neck supple.  Cardiovascular: Normal rate and regular rhythm.   Pulmonary/Chest: Effort normal and breath sounds normal.  Abdominal: Soft. There is no tenderness. There is no rebound and no guarding.  Musculoskeletal: Normal range of motion.  Neurological: Alert, no facial droop, fluent speech, moves all extremities symmetrically Skin: Skin is warm and dry.  Psychiatric: Cooperative   ED Treatments / Results  Labs (  all labs ordered are listed, but only abnormal results are displayed) Labs Reviewed  BASIC METABOLIC PANEL - Abnormal; Notable for the following:       Result Value   Glucose, Bld 100 (*)    All other components within normal limits  CBC  D-DIMER, QUANTITATIVE (NOT AT [facility])  I-STAT TROPOININ, ED  I-STAT BETA HCG BLOOD, ED ([facility], [facility], [facility] ONLY)    EKG  EKG Interpretation None       Radiology No results found.  Procedures Procedures (including critical care time)  Medications Ordered in ED Medications  ibuprofen (ADVIL,MOTRIN) tablet  600 mg (not administered)     Initial Impression / Assessment and Plan / ED Course  I have reviewed the triage vital signs and the nursing notes.  Pertinent labs & imaging results that were available during my care of the patient were reviewed by me and considered in my medical decision making (see chart for details).  Clinical Course     EKG from UC visualized and reviewed showing normal sinus rhythm no acute ST or T-wave changes. No evidence of pericarditis, heart strain, or acute ischemia. Patient brought chest x-ray from urgent care, and it was visualized by me from their disc. Shows no acute cardiopulmonary processes.  Blood work obtained in triage reviewed and reassuring. She has a negative troponin, but history is not suggestive of myocarditis or ACS. She has normal d-dimer and is felt to be low risk otherwise and ruled out for PE.   Suspect benign etiology of her chest pain. Discussed anti-inflammatory medications for home. Strict return and follow-up instructions are reviewed. She and her mother expressed understanding of all discharge instructions and felt comfortable with the plan of care.        Final Clinical Impressions(s) / ED Diagnoses   Final diagnoses:  Nonspecific chest pain    New Prescriptions New Prescriptions   No medications on file     Lavera Guiseana Duo Jenniah Bhavsar, MD 10/15/16 [phone]

## 2016-10-15 NOTE — Discharge Instructions (Signed)
Your workup today has been reassuring. You have been ruled out for a blood clot in her lungs. Your heart work-up does not show evidence of heart strain or heart attack.  I continue to take anti-inflammatory medication such as ibuprofen for pain control. Follow up closely with her primary care doctor for ongoing evaluation. Return without fail for worsening symptoms, including fever, difficulty breathing, passing out, escalating pain or any other symptoms concerning to.

## 2016-10-15 NOTE — ED Notes (Signed)
[facility]

## 2016-10-15 NOTE — ED Notes (Signed)
Pt alert, NAD, calm, interactive, resps e/u, speaking in clear complete sentences, no dyspnea noted, skin W&D, NSR on monitor, VSS, [facility]

## 2017-02-19 ENCOUNTER — Ambulatory Visit: Payer: 59 | Admitting: Internal Medicine

## 2017-03-05 ENCOUNTER — Encounter (INDEPENDENT_AMBULATORY_CARE_PROVIDER_SITE_OTHER): Payer: Self-pay

## 2017-03-05 ENCOUNTER — Ambulatory Visit (INDEPENDENT_AMBULATORY_CARE_PROVIDER_SITE_OTHER): Payer: 59 | Admitting: Internal Medicine

## 2017-03-05 ENCOUNTER — Encounter: Payer: Self-pay | Admitting: Internal Medicine

## 2017-03-05 VITALS — BP 128/80 | HR 81 | Temp 98.0°F | Resp 17 | Ht 65.0 in | Wt 149.4 lb

## 2017-03-05 DIAGNOSIS — E782 Mixed hyperlipidemia: Secondary | ICD-10-CM

## 2017-03-05 DIAGNOSIS — R5383 Other fatigue: Secondary | ICD-10-CM

## 2017-03-05 DIAGNOSIS — F411 Generalized anxiety disorder: Secondary | ICD-10-CM | POA: Diagnosis not present

## 2017-03-05 MED ORDER — ALPRAZOLAM 0.25 MG PO TABS: 0.2500 mg | ORAL_TABLET | Freq: Every evening | ORAL | 0 refills | Status: DC | PRN

## 2017-03-05 NOTE — Progress Notes (Signed)
Subjective:  Patient ID: Tiffany Bowman Hattabaugh, female    DOB: 1990/11/09  Age: 26 y.o. MRN: [id number]  CC: The primary encounter diagnosis was Fatigue, unspecified type. Diagnoses of Mixed hyperlipidemia and Anxiety state were also pertinent to this visit.  HPI Tiffany Bowman Tiffany Bowman presents for follow up , last seen in July.  ED evaluation in Dec 2017 for pleuritic chest pain.  On OCPs .   D dimer ,  Troponin negative,  No CT done .  Does kickboxing ,  Pain resolved. No recurence.    recurrent anxiety for the past 2 months.  Treated 2 years ago with SSRI by another MD>.   Did not tolerate a daily SSRI. Developed itching, was prescribed zanax in the past ,  Did not use it daily  Symptoms  of anxiety includingpanic attacks and picking at skin  Till it bleeds.  .  Using exercise daily to manage nerves.    Graduated from [employer] last May,  Decided OT was not for her \\starting  nursing school in the fall.  Finishing CNA curriculum currently.  Marland Kitchen.    Has been in a monogamous relationship for the past 2 years.    Contraception :  nexplanon dc'd in January due to bleeding,  Then oral contraceptives.  Now on weekly transdermal  Xulane patch and feels much better,  Periods are regular  ,  No mood swings.    No outpatient prescriptions prior to visit.   No facility-administered medications prior to visit.     Review of Systems;  Patient denies headache, fevers, malaise, unintentional weight loss, skin rash, eye pain, sinus congestion and sinus pain, sore throat, dysphagia,  hemoptysis , cough, dyspnea, wheezing, chest pain, palpitations, orthopnea, edema, abdominal pain, nausea, melena, diarrhea, constipation, flank pain, dysuria, hematuria, urinary  Frequency, nocturia, numbness, tingling, seizures,  Focal weakness, Loss of consciousness,  Tremor, insomnia, depression, and suicidal ideation.      Objective:  BP 128/80 (BP Location: Left Arm, Patient Position: Sitting, Cuff Size: Normal)   Pulse 81    Temp 98 F (36.7 C) (Oral)   Resp 17   Ht 5\' 5"  (1.651 Bowman)   Wt 149 lb 6.4 oz (67.8 kg)   SpO2 98%   BMI 24.86 kg/Bowman   BP Readings from Last 3 Encounters:  03/05/17 128/80  10/15/16 120/82  06/01/16 102/70    Wt Readings from Last 3 Encounters:  03/05/17 149 lb 6.4 oz (67.8 kg)  10/14/16 145 lb (65.8 kg)  06/01/16 148 lb 2 oz (67.2 kg)    General appearance: alert, cooperative and appears stated age Ears: normal TM's and external ear canals both ears Throat: lips, mucosa, and tongue normal; teeth and gums normal Neck: no adenopathy, no carotid bruit, supple, symmetrical, trachea midline and thyroid not enlarged, symmetric, no tenderness/mass/nodules Back: symmetric, no curvature. ROM normal. No CVA tenderness. Lungs: clear to auscultation bilaterally Heart: regular rate and rhythm, S1, S2 normal, no murmur, click, rub or gallop Abdomen: soft, non-tender; bowel sounds normal; no masses,  no organomegaly Pulses: 2+ and symmetric Skin: Skin color, texture, turgor normal. No rashes or lesions Lymph nodes: Cervical, supraclavicular, and axillary nodes normal. Psych: affect normal, makes good eye contact. No fidgeting,  Smiles easily.  Denies suicidal thoughts   No results found for: HGBA1C  Lab Results  Component Value Date   CREATININE 0.71 10/14/2016   CREATININE 0.67 06/01/2016    Lab Results  Component Value Date   WBC 9.5 10/14/2016  HGB 14.4 10/14/2016   HCT 41.1 10/14/2016   PLT 253 10/14/2016   GLUCOSE 100 (H) 10/14/2016   CHOL 169 06/01/2016   TRIG 59.0 06/01/2016   HDL 52.70 06/01/2016   LDLCALC 105 (H) 06/01/2016   ALT 12 06/01/2016   AST 14 06/01/2016   NA 137 10/14/2016   K 3.6 10/14/2016   CL 102 10/14/2016   CREATININE 0.71 10/14/2016   BUN 13 10/14/2016   CO2 26 10/14/2016   TSH 0.98 06/01/2016    No results found.  Assessment & Plan:   Problem List Items Addressed This Visit    Anxiety state    She has infrequent episodes of high  anxiety that border on panic attacks and affect her ability to perform well.  She does not want to take an SSRI and is requesting a short acting anxiolytic for prn use. The risks and benefits of benzodiazepine use were discussed with patient today including excessive sedation leading to respiratory depression,  impaired thinking/driving, and addiction.  Patient was advised to avoid concurrent use with alcohol, to use medication only as needed and not to share with others  .       Relevant Medications   ALPRAZolam (XANAX) 0.25 MG tablet    Other Visit Diagnoses    Fatigue, unspecified type    -  Primary   Relevant Orders   Comprehensive metabolic panel   TSH   CBC with Differential/Platelet   Mixed hyperlipidemia       Relevant Orders   Lipid panel     A total of 25 minutes of face to face time was spent with patient more than half of which was spent in counselling about the above mentioned conditions  and coordination of care   I am having Ms. Punches start on ALPRAZolam. I am also having her maintain her XULANE.  Meds ordered this encounter  Medications  . XULANE 150-35 MCG/24HR transdermal patch  . ALPRAZolam (XANAX) 0.25 MG tablet    Sig: Take 1 tablet (0.25 mg total) by mouth at bedtime as needed for anxiety.    Dispense:  20 tablet    Refill:  0    There are no discontinued medications.  Follow-up: Return in about 6 months (around 09/05/2017), or fasting labs soon .   Sherlene Shams, MD

## 2017-03-05 NOTE — Progress Notes (Signed)
Pre visit review using our clinic review tool, if applicable. No additional management support is needed unless otherwise documented below in the visit note. 

## 2017-03-05 NOTE — Patient Instructions (Signed)
I am prescribing alprazolam for "prn " use for panic attacks  Please do not use daily.  Daily anxiety should be treated differently  Return for fasting labs when you are able

## 2017-03-07 DIAGNOSIS — F410 Panic disorder [episodic paroxysmal anxiety] without agoraphobia: Secondary | ICD-10-CM | POA: Insufficient documentation

## 2017-03-07 DIAGNOSIS — F411 Generalized anxiety disorder: Secondary | ICD-10-CM

## 2017-03-07 HISTORY — DX: Panic disorder (episodic paroxysmal anxiety): F41.0

## 2017-03-07 NOTE — Assessment & Plan Note (Addendum)
She has infrequent episodes of high anxiety that border on panic attacks and affect her ability to perform well.  She does not want to take an SSRI and is requesting a short acting anxiolytic for prn use. The risks and benefits of benzodiazepine use were discussed with patient today including excessive sedation leading to respiratory depression,  impaired thinking/driving, and addiction.  Patient was advised to avoid concurrent use with alcohol, to use medication only as needed and not to share with others  .

## 2017-03-25 ENCOUNTER — Other Ambulatory Visit: Payer: 59

## 2017-04-02 ENCOUNTER — Other Ambulatory Visit: Payer: 59

## 2017-04-26 ENCOUNTER — Telehealth: Payer: Self-pay | Admitting: Internal Medicine

## 2017-04-26 NOTE — Telephone Encounter (Signed)
Pt called requesting a refill on her ALPRAZolam (XANAX) 0.25 MG tablet. Pt would like to be called when ready. Please advise, thank you!  Pharmacy - [id number] - [place], [place] - [address]  Call pt @ [phone]

## 2017-04-26 NOTE — Telephone Encounter (Signed)
Refilled: 03/05/2017 Last OV: 03/05/2017 Next OV: not scheduled

## 2017-04-27 MED ORDER — ALPRAZOLAM 0.25 MG PO TABS: 0.2500 mg | ORAL_TABLET | Freq: Every evening | ORAL | 0 refills | Status: DC | PRN

## 2017-04-27 NOTE — Telephone Encounter (Signed)
rx has been printed, signed and faxed.  

## 2017-04-27 NOTE — Telephone Encounter (Signed)
refill authorized

## 2017-06-10 ENCOUNTER — Other Ambulatory Visit: Payer: Self-pay | Admitting: *Deleted

## 2017-06-10 NOTE — Telephone Encounter (Signed)
Patient requested a medication refill for alprazolam  Pt contact [phone]

## 2017-06-10 NOTE — Telephone Encounter (Signed)
Refilled: 04/27/2017 Last OV: 03/05/2017 Next OV: not scheduled

## 2017-06-11 NOTE — Telephone Encounter (Signed)
Pt called back requesting an update. Please advise, thank you!  Pharmacy - [id number] - [place], [place] - [address]

## 2017-06-13 MED ORDER — ALPRAZOLAM 0.25 MG PO TABS: 0.2500 mg | ORAL_TABLET | Freq: Every evening | ORAL | 0 refills | Status: DC | PRN

## 2017-06-13 NOTE — Telephone Encounter (Signed)
REfill authorized  For 30 days, please call in or print,   She Needs OV prior to any more refills.  Going forward We need a policy about refills on benzo's for When the PCP is out of the office,  As I was this week.  you will need to let patients know and if they cannot wait for the physician to return,  Then you have to see if another physician will  Refill for 30 days,  So patient does  not have a benzo withdrawal.

## 2017-06-14 NOTE — Telephone Encounter (Signed)
Pt was notified. Rx was printed, signed and faxed.

## 2017-08-05 ENCOUNTER — Telehealth: Payer: Self-pay

## 2017-08-05 ENCOUNTER — Telehealth: Payer: Self-pay | Admitting: Internal Medicine

## 2017-08-05 MED ORDER — ALPRAZOLAM 0.25 MG PO TABS: 0.2500 mg | ORAL_TABLET | Freq: Every evening | ORAL | 0 refills | Status: DC | PRN

## 2017-08-05 NOTE — Telephone Encounter (Signed)
Refill for 30 days only.  OFFICE VISIT NEEDED in November  prior to any more refills

## 2017-08-05 NOTE — Telephone Encounter (Signed)
Last OV 03/05/2017 Next OV not scheduled Last refill 06/13/2017

## 2017-08-05 NOTE — Telephone Encounter (Signed)
Pt called to get a refill for ALPRAZolam (XANAX) 0.25 MG tablet. Thank you!

## 2017-08-05 NOTE — Telephone Encounter (Signed)
Refill request sent to PCP.

## 2017-08-05 NOTE — Telephone Encounter (Signed)
Please schedule patient for medication follow up in November. Thanks.

## 2017-08-06 NOTE — Telephone Encounter (Signed)
Lm on vm to call office and schedule appointment in November for medication follow up.

## 2017-08-06 NOTE — Telephone Encounter (Signed)
rx has been printed, signed and faxed.  

## 2017-09-08 ENCOUNTER — Ambulatory Visit: Payer: 59 | Admitting: Internal Medicine

## 2017-10-11 ENCOUNTER — Ambulatory Visit: Payer: 59 | Admitting: Internal Medicine

## 2017-10-18 ENCOUNTER — Ambulatory Visit: Payer: 59 | Admitting: Internal Medicine

## 2017-10-18 ENCOUNTER — Encounter: Payer: Self-pay | Admitting: Internal Medicine

## 2017-10-18 DIAGNOSIS — F410 Panic disorder [episodic paroxysmal anxiety] without agoraphobia: Secondary | ICD-10-CM

## 2017-10-18 DIAGNOSIS — R5383 Other fatigue: Secondary | ICD-10-CM

## 2017-10-18 DIAGNOSIS — E782 Mixed hyperlipidemia: Secondary | ICD-10-CM

## 2017-10-18 DIAGNOSIS — Z23 Encounter for immunization: Secondary | ICD-10-CM | POA: Diagnosis not present

## 2017-10-18 DIAGNOSIS — F411 Generalized anxiety disorder: Secondary | ICD-10-CM

## 2017-10-18 MED ORDER — PAROXETINE HCL 10 MG PO TABS: 10.0000 mg | ORAL_TABLET | Freq: Every day | ORAL | 5 refills | Status: DC

## 2017-10-18 MED ORDER — ALPRAZOLAM 0.25 MG PO TABS: 0.2500 mg | ORAL_TABLET | Freq: Every evening | ORAL | 5 refills | Status: DC | PRN

## 2017-10-18 NOTE — Progress Notes (Signed)
Subjective:  Patient ID: Tiffany Bowman Mcglinchey, female    DOB: Sep 26, 1991  Age: 26 y.o. MRN: [id number]  CC: Diagnoses of Need for immunization against influenza, Fatigue, unspecified type, Mixed hyperlipidemia, and Generalized anxiety disorder with panic attacks were pertinent to this visit.  HPI Tiffany Bowman Tiffany Bowman presents for 6 month follow up on GAD managed with prn alprazolam.  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  Doing well, but has insomnia w, wakes up in a panic 2 to 3 times per week,  Using the alprazolam. [facility] and doing well on a trial of paxil for her anxiety,  Wants to try a daily SSRI   Labs last done dec 2017  Outpatient Medications Prior to Visit  Medication Sig Dispense Refill  . XULANE 150-35 MCG/24HR transdermal patch     . ALPRAZolam (XANAX) 0.25 MG tablet Take 1 tablet (0.25 mg total) by mouth at bedtime as needed for anxiety. 30 tablet 0   No facility-administered medications prior to visit.     Review of Systems;  Patient denies headache, fevers, malaise, unintentional weight loss, skin rash, eye pain, sinus congestion and sinus pain, sore throat, dysphagia,  hemoptysis , cough, dyspnea, wheezing, chest pain, palpitations, orthopnea, edema, abdominal pain, nausea, melena, diarrhea, constipation, flank pain, dysuria, hematuria, urinary  Frequency, nocturia, numbness, tingling, seizures,  Focal weakness, Loss of consciousness,  Tremor, insomnia, depression, anxiety, and suicidal ideation.      Objective:  BP 110/84 (BP Location: Left Arm, Patient Position: Sitting, Cuff Size: Normal)   Pulse 88   Temp 98 F (36.7 C) (Oral)   Resp 15   Ht 5\' 5"  (1.651 Bowman)   Wt 145 lb (65.8 kg)   SpO2 99%   BMI 24.13 kg/Bowman   BP Readings from Last 3 Encounters:  10/18/17 110/84  03/05/17 128/80  10/15/16 120/82    Wt Readings from Last 3 Encounters:  10/18/17 145 lb (65.8 kg)  03/05/17 149 lb 6.4 oz (67.8 kg)  10/14/16 145 lb (65.8 kg)     General appearance: alert, cooperative and appears stated age Ears: normal TM's and external ear canals both ears Throat: lips, mucosa, and tongue normal; teeth and gums normal Neck: no adenopathy, no carotid bruit, supple, symmetrical, trachea midline and thyroid not enlarged, symmetric, no tenderness/mass/nodules Back: symmetric, no curvature. ROM normal. No CVA tenderness. Lungs: clear to auscultation bilaterally Heart: regular rate and rhythm, S1, S2 normal, no murmur, click, rub or gallop Abdomen: soft, non-tender; bowel sounds normal; no masses,  no organomegaly Pulses: 2+ and symmetric Skin: Skin color, texture, turgor normal. No rashes or lesions Lymph nodes: Cervical, supraclavicular, and axillary nodes normal.  No results found for: HGBA1C  Lab Results  Component Value Date   CREATININE 0.71 10/14/2016   CREATININE 0.67 06/01/2016    Lab Results  Component Value Date   WBC 9.5 10/14/2016   HGB 14.4 10/14/2016   HCT 41.1 10/14/2016   PLT 253 10/14/2016   GLUCOSE 100 (H) 10/14/2016   CHOL 169 06/01/2016   TRIG 59.0 06/01/2016   HDL 52.70 06/01/2016   LDLCALC 105 (H) 06/01/2016   ALT 12 06/01/2016   AST 14 06/01/2016   NA 137 10/14/2016   K 3.6 10/14/2016   CL 102 10/14/2016   CREATININE 0.71 10/14/2016   BUN 13 10/14/2016   CO2 26 10/14/2016   TSH 0.98 06/01/2016    No results found.  Assessment & Plan:   Problem List  Items Addressed This Visit    Generalized anxiety disorder with panic attacks    Trial of paxil 10 mg daily  Alprazolam refilled.  The risks and benefits of benzodiazepine use were discussed with patient today including excessive sedation leading to respiratory depression,  impaired thinking/driving, and addiction.  Patient was advised to avoid concurrent use with alcohol, to use medication only as needed and not to share with others  .   Checking thyroid, cbc      Relevant Medications   ALPRAZolam (XANAX) 0.25 MG tablet   PARoxetine  (PAXIL) 10 MG tablet    Other Visit Diagnoses    Need for immunization against influenza       Relevant Orders   Flu Vaccine QUAD 36+ mos IM (Completed)   Fatigue, unspecified type       Mixed hyperlipidemia          I am having [name]. Lobosco start on PARoxetine. I am also having her maintain her XULANE and ALPRAZolam.  Meds ordered this encounter  Medications  . ALPRAZolam (XANAX) 0.25 MG tablet    Sig: Take 1 tablet (0.25 mg total) by mouth at bedtime as needed for anxiety.    Dispense:  30 tablet    Refill:  5  . PARoxetine (PAXIL) 10 MG tablet    Sig: Take 1 tablet (10 mg total) by mouth daily.    Dispense:  30 tablet    Refill:  5    Medications Discontinued During This Encounter  Medication Reason  . ALPRAZolam (XANAX) 0.25 MG tablet Reorder    Follow-up: No Follow-up on file.   Sherlene Shamseresa L Flor Houdeshell, MD

## 2017-10-18 NOTE — Assessment & Plan Note (Addendum)
Trial of paxil 10 mg daily  Alprazolam refilled.  The risks and benefits of benzodiazepine use were discussed with patient today including excessive sedation leading to respiratory depression,  impaired thinking/driving, and addiction.  Patient was advised to avoid concurrent use with alcohol, to use medication only as needed and not to share with others  .   Checking thyroid, cbc

## 2017-10-18 NOTE — Patient Instructions (Signed)
I agree with starting a low dose of Paxil  for your generalized anxiety  Start with 1/2 tablet with or after dinner for the first 5 to 7 days  Increase to full tablet (10 mg) if tolerating

## 2017-10-19 LAB — LIPID PANEL
CHOL/HDL RATIO: 2
Cholesterol: 163 mg/dL (ref 0–200)
HDL: 70.4 mg/dL (ref >39.00)
LDL Cholesterol: 70 mg/dL (ref 0–99)
NONHDL: 92.85
Triglycerides: 116 mg/dL (ref 0.0–149.0)
VLDL: 23.2 mg/dL (ref 0.0–40.0)

## 2017-10-19 LAB — CBC WITH DIFFERENTIAL/PLATELET
BASOS PCT: 1.3 % (ref 0.0–3.0)
Basophils Absolute: 0.1 10*3/uL (ref 0.0–0.1)
EOS PCT: 1.1 % (ref 0.0–5.0)
Eosinophils Absolute: 0.1 10*3/uL (ref 0.0–0.7)
HCT: 40.7 % (ref 36.0–46.0)
Hemoglobin: 13.3 g/dL (ref 12.0–15.0)
LYMPHS ABS: 2.5 10*3/uL (ref 0.7–4.0)
Lymphocytes Relative: 30.2 % (ref 12.0–46.0)
MCHC: 32.8 g/dL (ref 30.0–36.0)
MCV: 91.4 fl (ref 78.0–100.0)
MONO ABS: 0.7 10*3/uL (ref 0.1–1.0)
MONOS PCT: 8.3 % (ref 3.0–12.0)
NEUTROS ABS: 4.9 10*3/uL (ref 1.4–7.7)
NEUTROS PCT: 59.1 % (ref 43.0–77.0)
PLATELETS: 260 10*3/uL (ref 150.0–400.0)
RBC: 4.45 Mil/uL (ref 3.87–5.11)
RDW: 12.5 % (ref 11.5–15.5)
WBC: 8.3 10*3/uL (ref 4.0–10.5)

## 2017-10-19 LAB — COMPREHENSIVE METABOLIC PANEL
ALT: 11 U/L (ref 0–35)
AST: 17 U/L (ref 0–37)
Albumin: 4.1 g/dL (ref 3.5–5.2)
Alkaline Phosphatase: 34 U/L — ABNORMAL LOW (ref 39–117)
BILIRUBIN TOTAL: 0.3 mg/dL (ref 0.2–1.2)
BUN: 13 mg/dL (ref 6–23)
CO2: 29 meq/L (ref 19–32)
Calcium: 8.9 mg/dL (ref 8.4–10.5)
Chloride: 102 mEq/L (ref 96–112)
Creatinine, Ser: 0.68 mg/dL (ref 0.40–1.20)
GFR: 112.08 mL/min (ref >60.00)
GLUCOSE: 97 mg/dL (ref 70–99)
Potassium: 4.7 mEq/L (ref 3.5–5.1)
SODIUM: 137 meq/L (ref 135–145)
Total Protein: 7.3 g/dL (ref 6.0–8.3)

## 2017-10-19 LAB — TSH: TSH: 1.91 u[IU]/mL (ref 0.35–4.50)

## 2017-10-22 ENCOUNTER — Telehealth: Payer: Self-pay | Admitting: Internal Medicine

## 2017-10-22 NOTE — Telephone Encounter (Signed)
Results given. Documented in result note

## 2017-10-22 NOTE — Telephone Encounter (Signed)
Copied from CRM [id number]. Topic: Quick Communication - See Telephone Encounter >> Oct 22, 2017  4:46 PM Terisa Starraylor, Brittany L wrote: CRM for notification. See Telephone encounter for:   10/22/17. Pt missed call from office for lab results. [facility] line busy she did not want to wait. Please call her at [phone]

## 2017-11-23 ENCOUNTER — Other Ambulatory Visit: Payer: Self-pay

## 2017-11-23 MED ORDER — PAROXETINE HCL 10 MG PO TABS: 10.0000 mg | ORAL_TABLET | Freq: Every day | ORAL | 1 refills | Status: DC

## 2017-11-25 ENCOUNTER — Ambulatory Visit: Payer: 59 | Admitting: Family Medicine

## 2018-03-08 ENCOUNTER — Other Ambulatory Visit: Payer: Self-pay

## 2018-03-08 ENCOUNTER — Encounter (HOSPITAL_BASED_OUTPATIENT_CLINIC_OR_DEPARTMENT_OTHER): Payer: Self-pay | Admitting: *Deleted

## 2018-03-08 ENCOUNTER — Emergency Department (HOSPITAL_BASED_OUTPATIENT_CLINIC_OR_DEPARTMENT_OTHER)
Admission: EM | Admit: 2018-03-08 | Discharge: 2018-03-08 | Disposition: A | Discharge status: Home / Self Care | Payer: 59 | Attending: Emergency Medicine | Admitting: Emergency Medicine

## 2018-03-08 DIAGNOSIS — H6123 Impacted cerumen, bilateral: Secondary | ICD-10-CM

## 2018-03-08 DIAGNOSIS — H6092 Unspecified otitis externa, left ear: Secondary | ICD-10-CM | POA: Diagnosis not present

## 2018-03-08 DIAGNOSIS — H9202 Otalgia, left ear: Secondary | ICD-10-CM | POA: Diagnosis present

## 2018-03-08 DIAGNOSIS — Z79899 Other long term (current) drug therapy: Secondary | ICD-10-CM | POA: Insufficient documentation

## 2018-03-08 DIAGNOSIS — F419 Anxiety disorder, unspecified: Secondary | ICD-10-CM | POA: Insufficient documentation

## 2018-03-08 DIAGNOSIS — H60502 Unspecified acute noninfective otitis externa, left ear: Secondary | ICD-10-CM

## 2018-03-08 MED ORDER — ACETAMINOPHEN 500 MG PO TABS
1000.0000 mg | ORAL_TABLET | Freq: Once | ORAL | Status: AC
  Administered 2018-03-08: 1000 mg via ORAL
  Filled 2018-03-08: qty 2

## 2018-03-08 MED ORDER — OFLOXACIN 0.3 % OT SOLN: 10.0000 [drp] | Freq: Every day | OTIC | 0 refills | Status: AC

## 2018-03-08 MED ORDER — IBUPROFEN 400 MG PO TABS
400.0000 mg | ORAL_TABLET | Freq: Once | ORAL | Status: AC
Start: 2018-03-08 — End: 2018-03-08
  Administered 2018-03-08: 400 mg via ORAL
  Filled 2018-03-08: qty 1

## 2018-03-08 NOTE — ED Provider Notes (Signed)
[facility] EMERGENCY DEPARTMENT Provider Note   CSN: [id number] Arrival date & time: 03/08/18  1346     History   Chief Complaint Chief Complaint  Patient presents with  . Otalgia    HPI Tiffany Bowman is a 27 y.o. female with no pertinent past medical history presenting with sudden onset left otalgia starting around 2 AM this morning.  She went to urgent care today and they attempted to remove the cerumen with irrigation but she was unable to tolerate due to pain.  She denies any fever, chills, cough, congestion, sore throat or other symptoms.  She has taken ibuprofen with some relief.  Denies any drainage, or loss of hearing.  HPI  Past Medical History:  Diagnosis Date  . Anxiety     Patient Active Problem List   Diagnosis Date Noted  . Generalized anxiety disorder with panic attacks 03/07/2017  . Lumbar herniated disc 06/01/2016  . Unplanned unwanted pregnancy 04/18/2014  . 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 LGSIL Pap smear of vagina 03/31/2014  . Venereal wart 03/25/2014  . Pap smear for cervical cancer screening 03/25/2014  . Screen for STD (sexually transmitted disease) 01/17/2014  . Unprotected sex 01/17/2014  . High risk sexual behavior 01/17/2014  . Routine general medical examination at a health care facility 06/06/2012    History reviewed. No pertinent surgical history.   OB History   None      Home Medications    Prior to Admission medications   Medication Sig Start Date End Date Taking? Authorizing Provider  ALPRAZolam (XANAX) 0.25 MG tablet Take 1 tablet (0.25 mg total) by mouth at bedtime as needed for anxiety. 10/18/17  Yes Sherlene Shams, MD  PARoxetine (PAXIL) 10 MG tablet Take 1 tablet (10 mg total) by mouth daily. 11/23/17  Yes Sherlene Shams, MD  [facility] 150-35 MCG/24HR transdermal patch  02/08/17  Yes [provider]  ofloxacin (FLOXIN) 0.3 % OTIC solution Place 10 drops  into the left ear daily for 7 days. 03/08/18 03/15/18  Georgiana Shore, PA-C    Family History Family History  Problem Relation Age of Onset  . Glaucoma Father   . Cancer Paternal Grandmother        breast cancer    Social History Social History   Tobacco Use  . Smoking status: Never Smoker  . Smokeless tobacco: Never Used  Substance Use Topics  . Alcohol use: Yes    Alcohol/week: 4.0 oz    Types: 8 Standard drinks or equivalent per week    Comment: every weekend  . Drug use: No     Allergies   Azithromycin; Prozac [fluoxetine hcl]; and Penicillins   Review of Systems Review of Systems  Constitutional: Negative for chills and fever.  HENT: Positive for ear pain. Negative for congestion, ear discharge, facial swelling, sore throat, tinnitus, trouble swallowing and voice change.   Gastrointestinal: Negative for nausea and vomiting.  Musculoskeletal: Negative for neck pain and neck stiffness.  Skin: Negative for color change, pallor and rash.  Neurological: Negative for dizziness and headaches.     Physical Exam Updated Vital Signs BP (!) 138/101   Pulse 89   Temp 98.4 F (36.9 C) (Oral)   Resp 20   Ht  (1.651 m)   Wt 67.1 kg (148 lb)   LMP 02/06/2018   SpO2 100%   BMI 24.63 kg/m   Physical Exam  Constitutional: She appears  well-developed and well-nourished. No distress.  HENT:  Head: Normocephalic.  Right Ear: External ear normal.  Left Ear: External ear normal.  Left ear canal with cerumen plug.  Small area allows for visualization of portion of the tympanic membrane which appears pearly gray and normal.  Eyes: Right eye exhibits no discharge. Left eye exhibits no discharge.  Neck: Normal range of motion. Neck supple.  Cardiovascular: Normal rate, regular rhythm and normal heart sounds.  Pulmonary/Chest: Effort normal and breath sounds normal. No respiratory distress.  Musculoskeletal: Normal range of motion.  Neurological: She is alert.  Skin:  Skin is warm and dry. No rash noted. She is not diaphoretic. No erythema. No pallor.  Psychiatric: She has a normal mood and affect. Her behavior is normal.  Nursing note and vitals reviewed.    ED Treatments / Results  Labs (all labs ordered are listed, but only abnormal results are displayed) Labs Reviewed - No data to display  EKG None  Radiology No results found.  Procedures .Ear Cerumen Removal Date/Time: 03/08/2018 6:37 PM Performed by: Georgiana Shore, PA-C Authorized by: Georgiana Shore, PA-C   Consent:    Consent obtained:  Verbal   Consent given by:  Patient   Risks discussed:  Bleeding, pain, TM perforation and incomplete removal   Alternatives discussed:  No treatment Procedure details:    Location:  R ear   Procedure type: curette   Post-procedure details:    Inspection:  TM intact   Hearing quality:  Normal   Patient tolerance of procedure:  Tolerated well, no immediate complications   (including critical care time)  Medications Ordered in ED Medications  ibuprofen (ADVIL,MOTRIN) tablet 400 mg (400 mg Oral Given 03/08/18 1404)  acetaminophen (TYLENOL) tablet 1,000 mg (1,000 mg Oral Given 03/08/18 1718)     Initial Impression / Assessment and Plan / ED Course  I have reviewed the triage vital signs and the nursing notes.  Pertinent labs & imaging results that were available during my care of the patient were reviewed by me and considered in my medical decision making (see chart for details).    Patient presenting with sudden onset left otalgia and cerumen wax plug.  Patient was seen prior to arrival at urgent care and unable to irrigate due to discomfort.  Patient was given pain medications while in the emergency department and improved.  Will irrigate for better visualization and comfort.  Left ear was irrigated by nursing and the ear canal appears irritated with mild white discharge.  Will treat for otitis externa.  Normal tympanic  membrane.  Cerumen removal performed by myself of the right ear using curette.   Patient significantly improved while in the emergency department. Charge home with symptomatic relief and ofloxacin and close follow-up with PCP as needed.  Discussed return precautions and patient understands and agrees with discharge plan. Final Clinical Impressions(s) / ED Diagnoses   Final diagnoses:  Bilateral impacted cerumen  Acute otitis externa of left ear, unspecified type    ED Discharge Orders        Ordered    ofloxacin (FLOXIN) 0.3 % OTIC solution  Daily     03/08/18 1817       Gregary Cromer 03/08/18 1840    Nira Conn, MD 03/09/18 [phone]

## 2018-03-08 NOTE — ED Triage Notes (Signed)
She woke at 2am with left ear pain. She went to UC and they tried to irrigate her ear but she was unable to tolerate the procedure due to pain.

## 2018-03-08 NOTE — ED Notes (Signed)
Pt was in severe pain during ear irrigation. Nurse was able to remove a very hard small clump of wax.  Pt experienced immediate relief.

## 2018-03-08 NOTE — Discharge Instructions (Signed)
As discussed, apply 10 drops once a day for a week to your left ear.  Alternate between Tylenol and ibuprofen as needed for pain.  Follow-up with your primary care provider.  Return if symptoms worsen or new concerning symptoms in the meantime.

## 2018-03-08 NOTE — ED Notes (Signed)
NAD at this time. Pt is stable and going home.  

## 2018-06-08 ENCOUNTER — Ambulatory Visit: Payer: 59 | Admitting: Family Medicine

## 2018-06-08 ENCOUNTER — Encounter: Payer: Self-pay | Admitting: Family Medicine

## 2018-06-08 ENCOUNTER — Encounter: Payer: Self-pay | Admitting: *Deleted

## 2018-06-08 VITALS — BP 100/70 | HR 77 | Temp 97.9°F | Resp 18 | Ht 65.0 in | Wt 156.4 lb

## 2018-06-08 DIAGNOSIS — Z79899 Other long term (current) drug therapy: Secondary | ICD-10-CM | POA: Diagnosis not present

## 2018-06-08 DIAGNOSIS — L731 Pseudofolliculitis barbae: Secondary | ICD-10-CM

## 2018-06-08 DIAGNOSIS — F419 Anxiety disorder, unspecified: Secondary | ICD-10-CM

## 2018-06-08 MED ORDER — ALPRAZOLAM 0.25 MG PO TABS: 0.2500 mg | ORAL_TABLET | Freq: Every evening | ORAL | 0 refills | Status: DC | PRN

## 2018-06-08 NOTE — Progress Notes (Signed)
Subjective:    Patient ID: Tiffany Bowman, female    DOB: June 25, 1991, 27 y.o.   MRN: [id number]  HPI  Patient presents to clinic for follow-up on anxiety and for Xanax refill.  She has been using daily Paxil 10 mg for the past 8 months and feels very good on this medication.  She is pleased with how much it has helped her mood.  Uses Xanax 0.25 mg on occasion when she has difficulty falling asleep.  States she has not used a Xanax in the past 2 weeks. Patient has no suicidal ideation.  Patient denies any other drug use, denies smoking marijuana or cigarettes.  She does drink alcohol usually on the weekends when with friends, and reports she did have a beer with dinner last night.  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 Generalized anxiety disorder with panic attacks 03/07/2017  . Lumbar herniated disc 06/01/2016  . Unplanned unwanted pregnancy 04/18/2014  . 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 LGSIL Pap smear of vagina 03/31/2014  . Venereal wart 03/25/2014  . Pap smear for cervical cancer screening 03/25/2014  . Screen for STD (sexually transmitted disease) 01/17/2014  . Unprotected sex 01/17/2014  . High risk sexual behavior 01/17/2014  . Routine general medical examination at a health care facility 06/06/2012   Social History   Tobacco Use  . Smoking status: Never Smoker  . Smokeless tobacco: Never Used  Substance Use Topics  . Alcohol use: Yes    Alcohol/week: 4.8 oz    Types: 8 Standard drinks or equivalent per week    Comment: every weekend   Review of Systems  Constitutional: Negative for chills, fatigue and fever.  HENT: Negative.   Eyes: Negative.   Respiratory: Negative for cough, shortness of breath and wheezing.   Cardiovascular: Negative for chest pain and leg swelling.  Gastrointestinal:  Negative.   Genitourinary: Negative.   Skin:       Ingrown hair left groin  Neurological: Negative for dizziness, light-headedness and headaches.  Psychiatric/Behavioral: Positive for sleep disturbance. Negative for suicidal ideas. The patient is nervous/anxious.        Anxiety symptoms improved with daily Paxil.  Occasional episodes of panic where she will use a Xanax and occasional times where she will use Xanax if she is unable to fall asleep.      Objective:   Physical Exam  Constitutional: She is oriented to person, place, and time. She appears well-developed and well-nourished. No distress.  HENT:  Head: Normocephalic and atraumatic.  Eyes: EOM are normal. No scleral icterus.  Cardiovascular: Normal rate, regular rhythm and normal heart sounds.  Pulmonary/Chest: Effort normal and breath sounds normal. No respiratory distress. She has no wheezes. She has no rales.  Neurological: She is alert and oriented to person, place, and time. Coordination normal.  Gait normal  Skin: Skin is warm and dry. No pallor.  Small pimple left upper thigh, no surrounding skin redness or cellulitis. Patient usually does shave this area.   Psychiatric: She has a normal mood and affect. Her behavior is normal. Judgment and thought content normal.  Nursing note and vitals reviewed.  Blood pressure 100/70, pulse 77, temperature 97.9 F (36.6 C), temperature source Oral, resp. rate 18, height 5\' 5"  (1.651 m), weight 156 lb 6 oz (70.9 kg),  last menstrual period 05/31/2018, SpO2 98 %.    Assessment & Plan:   Anxiety - Mood very good on Paxil 10mg . Will continue to use xanax on as needed basis. 30 tablets of xanxa given. [place] narcotic registry checked and is appropriate. Patient signed narcotic non-opiod contract and has given urine drug screen sample today.   Ingrown hair - Does not appear infected at this time. Advised to avoid shaving this area for at least next week and can apply thin layer of bacitracin  ointment BID.  If area worsens, let us know and we can do oral antibiotic at that time.

## 2018-06-08 NOTE — Patient Instructions (Addendum)
Great to meet you!  Practice good sleep hygiene - avoid screens 30 min prior to bedtime, try to go to bed at consistent time every night - these can help getting to sleep be easier

## 2018-06-09 LAB — DRUG ABUSE 10-50+ETHANOL, U
ALCOHOL, ETHYL (U): NEGATIVE
AMPHETAMINES (1000 ng/mL SCRN): NEGATIVE
BARBITURATES: NEGATIVE
BENZODIAZEPINES: NEGATIVE
COCAINE METABOLITES: NEGATIVE
MARIJUANA MET (50 NG/ML SCRN): NEGATIVE
METHADONE: NEGATIVE
METHAQUALONE: NEGATIVE
OPIATES: NEGATIVE
PHENCYCLIDINE: NEGATIVE
PROPOXYPHENE: NEGATIVE

## 2018-06-13 NOTE — Progress Notes (Signed)
Notes recorded by Tracey HarriesGuse, Kaoir Loree M, FNP on 06/10/2018 at 8:01 AM EDT Drug screen negative, consistent with using xanax on PRN basis. Patient reported last xanax use was over 2 weeks ago.

## 2018-07-05 ENCOUNTER — Ambulatory Visit: Payer: 59 | Admitting: Internal Medicine

## 2018-07-05 ENCOUNTER — Encounter: Payer: Self-pay | Admitting: Internal Medicine

## 2018-07-05 ENCOUNTER — Other Ambulatory Visit (HOSPITAL_COMMUNITY)
Admission: RE | Admit: 2018-07-05 | Discharge: 2018-07-05 | Disposition: A | Discharge status: Home / Self Care | Payer: 59 | Source: Ambulatory Visit | Attending: Internal Medicine | Admitting: Internal Medicine

## 2018-07-05 VITALS — BP 120/74 | HR 87 | Temp 98.9°F | Resp 14 | Ht 65.0 in | Wt 161.2 lb

## 2018-07-05 DIAGNOSIS — Z0000 Encounter for general adult medical examination without abnormal findings: Secondary | ICD-10-CM

## 2018-07-05 DIAGNOSIS — F40243 Fear of flying: Secondary | ICD-10-CM

## 2018-07-05 DIAGNOSIS — F411 Generalized anxiety disorder: Secondary | ICD-10-CM

## 2018-07-05 DIAGNOSIS — Z124 Encounter for screening for malignant neoplasm of cervix: Secondary | ICD-10-CM | POA: Diagnosis not present

## 2018-07-05 DIAGNOSIS — F410 Panic disorder [episodic paroxysmal anxiety] without agoraphobia: Secondary | ICD-10-CM

## 2018-07-05 NOTE — Progress Notes (Signed)
Patient ID: Tiffany Bowman, female    DOB: 1991/02/29  Age: 27 y.o. MRN: [id number]  The patient is here for annual preventive examination and management of other chronic and acute problems.   The risk factors are reflected in the social history.  The roster of all physicians providing medical care to patient - is listed in the Snapshot section of the chart.  Activities of daily living:  The patient is 100% independent in all ADLs: dressing, toileting, feeding as well as independent mobility  Home safety : The patient has smoke detectors in the home. They wear seatbelts.  There are no firearms at home. There is no violence in the home.   There is no risks for hepatitis, STDs or HIV. There is no   history of blood transfusion. They have no travel history to infectious disease endemic areas of the world.  The patient has seen their dentist in the last six month. They have seen their eye doctor in the last year.   They do not  have excessive sun exposure. Discussed the need for sun protection: hats, long sleeves and use of sunscreen if there is significant sun exposure.   Diet: the importance of a healthy diet is discussed. They do have a healthy diet.  The benefits of regular aerobic exercise were discussed. She walks 4 times per week ,  20 minutes.   Depression screen: there are no signs or vegative symptoms of depression- irritability, change in appetite, anhedonia, sadness/tearfullness.   The following portions of the patient's history were reviewed and updated as appropriate: allergies, current medications, past family history, past medical history,  past surgical history, past social history  and problem list.  Visual acuity was not assessed per patient preference since she has regular follow up with her ophthalmologist. Hearing and body mass index were assessed and reviewed.   During the course of the visit the patient was educated and counseled about appropriate screening and  preventive services including : fall prevention , diabetes screening, nutrition counseling, colorectal cancer screening, and recommended immunizations.    CC: The primary encounter diagnosis was Pap smear for cervical cancer screening. Diagnoses of Routine general medical examination at a health care facility, Generalized anxiety disorder with panic attacks, and Fear of flying were also pertinent to this visit.  History Jacquie has a past medical history of Anxiety.   She has no past surgical history on file.   Her family history includes Cancer in her paternal grandmother; Glaucoma in her father.She reports that she has never smoked. She has never used smokeless tobacco. She reports that she drinks about 8.0 standard drinks of alcohol per week. She reports that she does not use drugs.  Outpatient Medications Prior to Visit  Medication Sig Dispense Refill  . ALPRAZolam (XANAX) 0.25 MG tablet Take 1 tablet (0.25 mg total) by mouth at bedtime as needed for anxiety. 30 tablet 0  . PARoxetine (PAXIL) 10 MG tablet Take 1 tablet (10 mg total) by mouth daily. 90 tablet 1  . XULANE 150-35 MCG/24HR transdermal patch      No facility-administered medications prior to visit.     Review of Systems   Patient denies headache, fevers, malaise, unintentional weight loss, skin rash, eye pain, sinus congestion and sinus pain, sore throat, dysphagia,  hemoptysis , cough, dyspnea, wheezing, chest pain, palpitations, orthopnea, edema, abdominal pain, nausea, melena, diarrhea, constipation, flank pain, dysuria, hematuria, urinary  Frequency, nocturia, numbness, tingling, seizures,  Focal weakness, Loss of consciousness,  Tremor, insomnia, depression, anxiety, and suicidal ideation.      Objective:  BP 120/74 (BP Location: Left Arm, Patient Position: Sitting, Cuff Size: Normal)   Pulse 87   Temp 98.9 F (37.2 C) (Oral)   Resp 14   Ht 5\' 5"  (1.651 m)   Wt 161 lb 3.2 oz (73.1 kg)   SpO2 98%   BMI 26.83  kg/m   Physical Exam   General Appearance:    Alert, cooperative, no distress, appears stated age  Head:    Normocephalic, without obvious abnormality, atraumatic  Eyes:    PERRL, conjunctiva/corneas clear, EOM's intact, fundi    benign, both eyes  Ears:    Normal TM's and external ear canals, both ears  Nose:   Nares normal, septum midline, mucosa normal, no drainage    or sinus tenderness  Throat:   Lips, mucosa, and tongue normal; teeth and gums normal  Neck:   Supple, symmetrical, trachea midline, no adenopathy;    thyroid:  no enlargement/tenderness/nodules; no carotid   bruit or JVD  Back:     Symmetric, no curvature, ROM normal, no CVA tenderness  Lungs:     Clear to auscultation bilaterally, respirations unlabored  Chest Wall:    No tenderness or deformity   Heart:    Regular rate and rhythm, S1 and S2 normal, no murmur, rub   or gallop  Breast Exam:    No tenderness, masses, or nipple abnormality  Abdomen:     Soft, non-tender, bowel sounds active all four quadrants,    no masses, no organomegaly  Genitalia:    Pelvic: cervix normal in appearance, external genitalia normal, no adnexal masses or tenderness, no cervical motion tenderness, rectovaginal septum normal, uterus normal size, shape, and consistency and vagina normal without discharge  Extremities:   Extremities normal, atraumatic, no cyanosis or edema  Pulses:   2+ and symmetric all extremities  Skin:   Skin color, texture, turgor normal, no rashes or lesions  Lymph nodes:   Cervical, supraclavicular, and axillary nodes normal  Neurologic:   CNII-XII intact, normal strength, sensation and reflexes    throughout       Assessment & Plan:   Problem List Items Addressed This Visit    Fear of flying    Instructed to use alprazolam prn up to 4 in a 24 hour period       Generalized anxiety disorder with panic attacks    Improved symptoms management with  paxil 10 mg daily  Alprazolam refilled.  The risks and  benefits of benzodiazepine use were discussed with patient today including excessive sedation leading to respiratory depression,  impaired thinking/driving, and addiction.  Patient was advised to avoid concurrent use with alcohol, to use medication only as needed and not to share with others  .   Checking thyroid, cbc      Pap smear for cervical cancer screening - Primary    She has been unable to schedule her PAP smear with Dr Cherly Hensen > PAP was done today and will be forwarded to Dr Cherly Hensen       Relevant Orders   Cytology - PAP   Routine general medical examination at a health care facility    Annual comprehensive preventive exam was done as well as an evaluation and management of chronic conditions .  During the course of the visit the patient was educated and counseled about appropriate screening and preventive services including :  diabetes screening, lipid analysis with projected  10 year  risk for CAD , nutrition counseling, breast, cervical and colorectal cancer screening, and recommended immunizations.  Printed recommendations for health maintenance screenings was given         I am having Rolinda Roan. [place]in maintain her XULANE, PARoxetine, and ALPRAZolam.  No orders of the defined types were placed in this encounter.   There are no discontinued medications.  Follow-up: No follow-ups on file.   Sherlene Shams, MD

## 2018-07-05 NOTE — Patient Instructions (Addendum)
Your labs were all great in December  You can double your dose of alprazolam before you fly , and take a 3 rd dose 30 minutes later if needed . The maximum dose  Is 1 mg ( 4 tablets) in a 24 hour period   Health Maintenance, Female Adopting a healthy lifestyle and getting preventive care can go a long way to promote health and wellness. Talk with your health care provider about what schedule of regular examinations is right for you. This is a good chance for you to check in with your provider about disease prevention and staying healthy. In between checkups, there are plenty of things you can do on your own. Experts have done a lot of research about which lifestyle changes and preventive measures are most likely to keep you healthy. Ask your health care provider for more information. Weight and diet Eat a healthy diet  Be sure to include plenty of vegetables, fruits, low-fat dairy products, and lean protein.  Do not eat a lot of foods high in solid fats, added sugars, or salt.  Get regular exercise. This is one of the most important things you can do for your health. ? Most adults should exercise for at least 150 minutes each week. The exercise should increase your heart rate and make you sweat (moderate-intensity exercise). ? Most adults should also do strengthening exercises at least twice a week. This is in addition to the moderate-intensity exercise.  Maintain a healthy weight  Body mass index (BMI) is a measurement that can be used to identify possible weight problems. It estimates body fat based on height and weight. Your health care provider can help determine your BMI and help you achieve or maintain a healthy weight.  For females 65 years of age and older: ? A BMI below 18.5 is considered underweight. ? A BMI of 18.5 to 24.9 is normal. ? A BMI of 25 to 29.9 is considered overweight. ? A BMI of 30 and above is considered obese.  Watch levels of cholesterol and blood lipids  You  should start having your blood tested for lipids and cholesterol at 26 years of age, then have this test every 5 years.  You may need to have your cholesterol levels checked more often if: ? Your lipid or cholesterol levels are high. ? You are older than 26 years of age. ? You are at high risk for heart disease.  Cancer screening Lung Cancer  Lung cancer screening is recommended for adults 38-58 years old who are at high risk for lung cancer because of a history of smoking.  A yearly low-dose CT scan of the lungs is recommended for people who: ? Currently smoke. ? Have quit within the past 15 years. ? Have at least a 30-pack-year history of smoking. A pack year is smoking an average of one pack of cigarettes a day for 1 year.  Yearly screening should continue until it has been 15 years since you quit.  Yearly screening should stop if you develop a health problem that would prevent you from having lung cancer treatment.  Breast Cancer  Practice breast self-awareness. This means understanding how your breasts normally appear and feel.  It also means doing regular breast self-exams. Let your health care provider know about any changes, no matter how small.  If you are in your 20s or 30s, you should have a clinical breast exam (CBE) by a health care provider every 1-3 years as part of a regular health  exam.  If you are 40 or older, have a CBE every year. Also consider having a breast X-ray (mammogram) every year.  If you have a family history of breast cancer, talk to your health care provider about genetic screening.  If you are at high risk for breast cancer, talk to your health care provider about having an MRI and a mammogram every year.  Breast cancer gene (BRCA) assessment is recommended for women who have family members with BRCA-related cancers. BRCA-related cancers include: ? Breast. ? Ovarian. ? Tubal. ? Peritoneal cancers.  Results of the assessment will determine the  need for genetic counseling and BRCA1 and BRCA2 testing.  Cervical Cancer Your health care provider may recommend that you be screened regularly for cancer of the pelvic organs (ovaries, uterus, and vagina). This screening involves a pelvic examination, including checking for microscopic changes to the surface of your cervix (Pap test). You may be encouraged to have this screening done every 3 years, beginning at age 40.  For women ages 36-65, health care providers may recommend pelvic exams and Pap testing every 3 years, or they may recommend the Pap and pelvic exam, combined with testing for human papilloma virus (HPV), every 5 years. Some types of HPV increase your risk of cervical cancer. Testing for HPV may also be done on women of any age with unclear Pap test results.  Other health care providers may not recommend any screening for nonpregnant women who are considered low risk for pelvic cancer and who do not have symptoms. Ask your health care provider if a screening pelvic exam is right for you.  If you have had past treatment for cervical cancer or a condition that could lead to cancer, you need Pap tests and screening for cancer for at least 20 years after your treatment. If Pap tests have been discontinued, your risk factors (such as having a new sexual partner) need to be reassessed to determine if screening should resume. Some women have medical problems that increase the chance of getting cervical cancer. In these cases, your health care provider may recommend more frequent screening and Pap tests.  Colorectal Cancer  This type of cancer can be detected and often prevented.  Routine colorectal cancer screening usually begins at 26 years of age and continues through 26 years of age.  Your health care provider may recommend screening at an earlier age if you have risk factors for colon cancer.  Your health care provider may also recommend using home test kits to check for hidden blood  in the stool.  A small camera at the end of a tube can be used to examine your colon directly (sigmoidoscopy or colonoscopy). This is done to check for the earliest forms of colorectal cancer.  Routine screening usually begins at age 29.  Direct examination of the colon should be repeated every 5-10 years through 26 years of age. However, you may need to be screened more often if early forms of precancerous polyps or small growths are found.  Skin Cancer  Check your skin from head to toe regularly.  Tell your health care provider about any new moles or changes in moles, especially if there is a change in a mole's shape or color.  Also tell your health care provider if you have a mole that is larger than the size of a pencil eraser.  Always use sunscreen. Apply sunscreen liberally and repeatedly throughout the day.  Protect yourself by wearing long sleeves, pants, a wide-brimmed  hat, and sunglasses whenever you are outside.  Heart disease, diabetes, and high blood pressure  High blood pressure causes heart disease and increases the risk of stroke. High blood pressure is more likely to develop in: ? People who have blood pressure in the high end of the normal range (130-139/85-89 mm Hg). ? People who are overweight or obese. ? People who are African American.  If you are 32-41 years of age, have your blood pressure checked every 3-5 years. If you are 14 years of age or older, have your blood pressure checked every year. You should have your blood pressure measured twice-once when you are at a hospital or clinic, and once when you are not at a hospital or clinic. Record the average of the two measurements. To check your blood pressure when you are not at a hospital or clinic, you can use: ? An automated blood pressure machine at a pharmacy. ? A home blood pressure monitor.  If you are between 83 years and 24 years old, ask your health care provider if you should take aspirin to prevent  strokes.  Have regular diabetes screenings. This involves taking a blood sample to check your fasting blood sugar level. ? If you are at a normal weight and have a low risk for diabetes, have this test once every three years after 26 years of age. ? If you are overweight and have a high risk for diabetes, consider being tested at a younger age or more often. Preventing infection Hepatitis B  If you have a higher risk for hepatitis B, you should be screened for this virus. You are considered at high risk for hepatitis B if: ? You were born in a country where hepatitis B is common. Ask your health care provider which countries are considered high risk. ? Your parents were born in a high-risk country, and you have not been immunized against hepatitis B (hepatitis B vaccine). ? You have HIV or AIDS. ? You use needles to inject street drugs. ? You live with someone who has hepatitis B. ? You have had sex with someone who has hepatitis B. ? You get hemodialysis treatment. ? You take certain medicines for conditions, including cancer, organ transplantation, and autoimmune conditions.  Hepatitis C  Blood testing is recommended for: ? Everyone born from [birth year] through 1964. ? Anyone with known risk factors for hepatitis C.  Sexually transmitted infections (STIs)  You should be screened for sexually transmitted infections (STIs) including gonorrhea and chlamydia if: ? You are sexually active and are younger than 26 years of age. ? You are older than 26 years of age and your health care provider tells you that you are at risk for this type of infection. ? Your sexual activity has changed since you were last screened and you are at an increased risk for chlamydia or gonorrhea. Ask your health care provider if you are at risk.  If you do not have HIV, but are at risk, it may be recommended that you take a prescription medicine daily to prevent HIV infection. This is called pre-exposure prophylaxis  (PrEP). You are considered at risk if: ? You are sexually active and do not regularly use condoms or know the HIV status of your partner(s). ? You take drugs by injection. ? You are sexually active with a partner who has HIV.  Talk with your health care provider about whether you are at high risk of being infected with HIV. If you choose to begin  PrEP, you should first be tested for HIV. You should then be tested every 3 months for as long as you are taking PrEP. Pregnancy  If you are premenopausal and you may become pregnant, ask your health care provider about preconception counseling.  If you may become pregnant, take 400 to 800 micrograms (mcg) of folic acid every day.  If you want to prevent pregnancy, talk to your health care provider about birth control (contraception). Osteoporosis and menopause  Osteoporosis is a disease in which the bones lose minerals and strength with aging. This can result in serious bone fractures. Your risk for osteoporosis can be identified using a bone density scan.  If you are 53 years of age or older, or if you are at risk for osteoporosis and fractures, ask your health care provider if you should be screened.  Ask your health care provider whether you should take a calcium or vitamin D supplement to lower your risk for osteoporosis.  Menopause may have certain physical symptoms and risks.  Hormone replacement therapy may reduce some of these symptoms and risks. Talk to your health care provider about whether hormone replacement therapy is right for you. Follow these instructions at home:  Schedule regular health, dental, and eye exams.  Stay current with your immunizations.  Do not use any tobacco products including cigarettes, chewing tobacco, or electronic cigarettes.  If you are pregnant, do not drink alcohol.  If you are breastfeeding, limit how much and how often you drink alcohol.  Limit alcohol intake to no more than 1 drink per day for  nonpregnant women. One drink equals 12 ounces of beer, 5 ounces of wine, or 1 ounces of hard liquor.  Do not use street drugs.  Do not share needles.  Ask your health care provider for help if you need support or information about quitting drugs.  Tell your health care provider if you often feel depressed.  Tell your health care provider if you have ever been abused or do not feel safe at home. This information is not intended to replace advice given to you by your health care provider. Make sure you discuss any questions you have with your health care provider. Document Released: 05/04/2011 Document Revised: 03/26/2016 Document Reviewed: 07/23/2015 Elsevier Interactive Patient Education  [employer].

## 2018-07-06 DIAGNOSIS — F40243 Fear of flying: Secondary | ICD-10-CM | POA: Insufficient documentation

## 2018-07-06 NOTE — Assessment & Plan Note (Signed)
She has been unable to schedule her PAP smear with Dr Cherly Hensen > PAP was done today and will be forwarded to Dr Cherly Hensen

## 2018-07-06 NOTE — Assessment & Plan Note (Signed)
Improved symptoms management with  paxil 10 mg daily  Alprazolam refilled.  The risks and benefits of benzodiazepine use were discussed with patient today including excessive sedation leading to respiratory depression,  impaired thinking/driving, and addiction.  Patient was advised to avoid concurrent use with alcohol, to use medication only as needed and not to share with others  .   Checking thyroid, cbc

## 2018-07-06 NOTE — Assessment & Plan Note (Signed)
Annual comprehensive preventive exam was done as well as an evaluation and management of chronic conditions .  During the course of the visit the patient was educated and counseled about appropriate screening and preventive services including :  diabetes screening, lipid analysis with projected  10 year  risk for CAD , nutrition counseling, breast, cervical and colorectal cancer screening, and recommended immunizations.  Printed recommendations for health maintenance screenings was given 

## 2018-07-06 NOTE — Assessment & Plan Note (Signed)
Instructed to use alprazolam prn up to 4 in a 24 hour period

## 2018-07-08 LAB — CYTOLOGY - PAP
Candida vaginitis: NEGATIVE
Chlamydia: NEGATIVE
Diagnosis: NEGATIVE
HPV (WINDOPATH): NOT DETECTED
Neisseria Gonorrhea: NEGATIVE
TRICH (WINDOWPATH): NEGATIVE

## 2018-07-11 LAB — CERVICOVAGINAL ANCILLARY ONLY: Herpes: NEGATIVE

## 2018-07-14 ENCOUNTER — Telehealth: Payer: Self-pay | Admitting: Internal Medicine

## 2018-07-14 DIAGNOSIS — Z3201 Encounter for pregnancy test, result positive: Secondary | ICD-10-CM

## 2018-07-14 NOTE — Telephone Encounter (Signed)
Pt called in and stated that she was late for her menstrual cycle last week and took a home pregnancy test on 07/08/2018 which resulted positive. Pt is wanting to know if she can come in for HCG blood test or does pt need to have an office visit?

## 2018-07-14 NOTE — Telephone Encounter (Signed)
No office visit needed.  Test ordered.

## 2018-07-14 NOTE — Telephone Encounter (Signed)
Request for HCG blood test.

## 2018-07-14 NOTE — Telephone Encounter (Signed)
Pt. returned call to receive lab results.  Was advised of recent lab results on 07/13/18; see result note.   Pt. Reported she was late for her menstrual cycle last week, and completed a "First Response" home pregnancy test on Friday, 07/08/18, which was positive.  Requested to come into the office to have a blood test, to verify/ confirm the positive results.  Advised will send message to Dr. Darrick Huntsmanullo with pt's. request.   Pt. agrees with plan.

## 2018-07-14 NOTE — Addendum Note (Signed)
Addended by: Sherlene ShamsULLO, Mairyn Lenahan L on: 07/14/2018 04:25 PM   Modules accepted: Orders

## 2018-07-15 NOTE — Telephone Encounter (Signed)
Lab has been scheduled 

## 2018-07-15 NOTE — Telephone Encounter (Signed)
LMTCB. Need to schedule pt for a non fasting lab appt. Lab has been ordered. PEC may speak with pt.  

## 2018-07-18 ENCOUNTER — Other Ambulatory Visit (INDEPENDENT_AMBULATORY_CARE_PROVIDER_SITE_OTHER): Payer: 59

## 2018-07-18 DIAGNOSIS — Z3201 Encounter for pregnancy test, result positive: Secondary | ICD-10-CM | POA: Diagnosis not present

## 2018-07-19 LAB — HCG, SERUM, QUALITATIVE: PREG SERUM: POSITIVE — AB

## 2018-08-22 ENCOUNTER — Other Ambulatory Visit: Payer: Self-pay

## 2018-08-22 ENCOUNTER — Encounter: Payer: Self-pay | Admitting: Obstetrics

## 2018-08-22 ENCOUNTER — Other Ambulatory Visit (HOSPITAL_COMMUNITY)
Admission: RE | Admit: 2018-08-22 | Discharge: 2018-08-22 | Disposition: A | Discharge status: Home / Self Care | Payer: Medicaid Other | Source: Ambulatory Visit | Attending: Obstetrics | Admitting: Obstetrics

## 2018-08-22 ENCOUNTER — Ambulatory Visit (INDEPENDENT_AMBULATORY_CARE_PROVIDER_SITE_OTHER): Payer: Medicaid Other | Admitting: Obstetrics

## 2018-08-22 VITALS — BP 125/79 | HR 95 | Temp 98.1°F | Wt 157.2 lb

## 2018-08-22 DIAGNOSIS — Z23 Encounter for immunization: Secondary | ICD-10-CM

## 2018-08-22 DIAGNOSIS — Z3401 Encounter for supervision of normal first pregnancy, first trimester: Secondary | ICD-10-CM | POA: Insufficient documentation

## 2018-08-22 DIAGNOSIS — Z3481 Encounter for supervision of other normal pregnancy, first trimester: Secondary | ICD-10-CM | POA: Diagnosis not present

## 2018-08-22 DIAGNOSIS — Z3A10 10 weeks gestation of pregnancy: Secondary | ICD-10-CM | POA: Insufficient documentation

## 2018-08-22 DIAGNOSIS — Z3400 Encounter for supervision of normal first pregnancy, unspecified trimester: Secondary | ICD-10-CM | POA: Insufficient documentation

## 2018-08-22 MED ORDER — VITAFOL ULTRA 29-0.6-0.4-200 MG PO CAPS: 1.0000 | ORAL_CAPSULE | Freq: Every day | ORAL | 4 refills | Status: DC

## 2018-08-22 NOTE — Progress Notes (Signed)
Presents for NOB visit.  FLU given in RD, tolerated well.

## 2018-08-22 NOTE — Addendum Note (Signed)
Addended by: Brock Bad on: 08/22/2018 02:38 PM   Modules accepted: Orders

## 2018-08-22 NOTE — Progress Notes (Signed)
Subjective:    Tiffany Bowman is being seen today for her first obstetrical visit.  This is a planned pregnancy. She is at [redacted]w[redacted]d gestation. Her obstetrical history is significant for none. Relationship with FOB: spouse, living together. Patient does intend to breast feed. Pregnancy history fully reviewed.  The information documented in the HPI was reviewed and verified.  Menstrual History: OB History    Gravida  1   Para      Term      Preterm      AB      Living        SAB      TAB      Ectopic      Multiple      Live Births               Patient's last menstrual period was 06/09/2018.    Past Medical History:  Diagnosis Date  . Anxiety   . Vaginal Pap smear, abnormal     Past Surgical History:  Procedure Laterality Date  . WISDOM TOOTH EXTRACTION       (Not in a hospital admission) Allergies  Allergen Reactions  . Azithromycin Itching  . Prozac [Fluoxetine Hcl] Itching    tching  . Penicillins Rash    Social History   Tobacco Use  . Smoking status: Never Smoker  . Smokeless tobacco: Never Used  Substance Use Topics  . Alcohol use: Not Currently    Alcohol/week: 8.0 standard drinks    Types: 8 Standard drinks or equivalent per week    Comment: every weekend    Family History  Problem Relation Age of Onset  . Hypertension Mother   . Glaucoma Father   . Cancer Paternal Grandmother        breast cancer  . Anxiety disorder Sister   . Diabetes Maternal Grandmother      Review of Systems Constitutional: negative for weight loss Gastrointestinal: negative for vomiting Genitourinary:negative for genital lesions and vaginal discharge and dysuria Musculoskeletal:negative for back pain Behavioral/Psych: negative for abusive relationship, depression, illegal drug usage and tobacco use    Objective:    BP 125/79   Pulse 95   Temp 98.1 F (36.7 C)   Wt 157 lb 3.2 oz (71.3 kg)   LMP 06/09/2018   BMI 26.16 kg/m  General Appearance:     Alert, cooperative, no distress, appears stated age  Head:    Normocephalic, without obvious abnormality, atraumatic  Eyes:    PERRL, conjunctiva/corneas clear, EOM's intact, fundi    benign, both eyes  Ears:    Normal TM's and external ear canals, both ears  Nose:   Nares normal, septum midline, mucosa normal, no drainage    or sinus tenderness  Throat:   Lips, mucosa, and tongue normal; teeth and gums normal  Neck:   Supple, symmetrical, trachea midline, no adenopathy;    thyroid:  no enlargement/tenderness/nodules; no carotid   bruit or JVD  Back:     Symmetric, no curvature, ROM normal, no CVA tenderness  Lungs:     Clear to auscultation bilaterally, respirations unlabored  Chest Wall:    No tenderness or deformity   Heart:    Regular rate and rhythm, S1 and S2 normal, no murmur, rub   or gallop  Breast Exam:    No tenderness, masses, or nipple abnormality  Abdomen:     Soft, non-tender, bowel sounds active all four quadrants,    no masses, no organomegaly  Genitalia:    Normal female without lesion, discharge or tenderness  Extremities:   Extremities normal, atraumatic, no cyanosis or edema  Pulses:   2+ and symmetric all extremities  Skin:   Skin color, texture, turgor normal, no rashes or lesions  Lymph nodes:   Cervical, supraclavicular, and axillary nodes normal  Neurologic:   CNII-XII intact, normal strength, sensation and reflexes    throughout      Lab Review Urine pregnancy test Labs reviewed yes Radiologic studies reviewed no Assessment:     1. Encounter for supervision of normal first pregnancy in first trimester Rx: - Culture, OB Urine - Hemoglobinopathy evaluation - Obstetric Panel, Including HIV - Genetic Screening - Cystic Fibrosis Mutation 97 - Flu Vaccine QUAD 36+ mos IM (Fluarix, Quad PF) - Enroll Patient in Babyscripts - Prenat-Fe Poly-Methfol-FA-DHA (VITAFOL ULTRA) 29-0.6-0.4-200 MG CAPS; Take 1 capsule by mouth daily before breakfast.  Dispense: 90  capsule; Refill: 4   Plan:     Prenatal vitamins.  Counseling provided regarding continued use of seat belts, cessation of alcohol consumption, smoking or use of illicit drugs; infection precautions i.e., influenza/TDAP immunizations, toxoplasmosis,CMV, parvovirus, listeria and varicella; workplace safety, exercise during pregnancy; routine dental care, safe medications, sexual activity, hot tubs, saunas, pools, travel, caffeine use, fish and methlymercury, potential toxins, hair treatments, varicose veins Weight gain recommendations per IOM guidelines reviewed: underweight/BMI< 18.5--> gain 28 - 40 lbs; normal weight/BMI 18.5 - 24.9--> gain 25 - 35 lbs; overweight/BMI 25 - 29.9--> gain 15 - 25 lbs; obese/BMI >30->gain  11 - 20 lbs Problem list reviewed and updated. FIRST/CF mutation testing/NIPT/QUAD SCREEN/fragile X/Ashkenazi Jewish population testing/Spinal muscular atrophy discussed: requested. Role of ultrasound in pregnancy discussed; fetal survey: requested. Amniocentesis discussed: not indicated.  Meds ordered this encounter  Medications  . Prenat-Fe Poly-Methfol-FA-DHA (VITAFOL ULTRA) 29-0.6-0.4-200 MG CAPS    Sig: Take 1 capsule by mouth daily before breakfast.    Dispense:  90 capsule    Refill:  4   Orders Placed This Encounter  Procedures  . Culture, OB Urine  . Flu Vaccine QUAD 36+ mos IM (Fluarix, Quad PF)  . Hemoglobinopathy evaluation  . Obstetric Panel, Including HIV  . Genetic Screening  . Cystic Fibrosis Mutation 97    Follow up in 5 weeks. 50% of 20 min visit spent on counseling and coordination of care.     Brock Bad MD 08-22-2018

## 2018-08-22 NOTE — Patient Instructions (Signed)
Eating Plan for Pregnant Women While you are pregnant, your body will require additional nutrition to help support your growing baby. It is recommended that you consume:  150 additional calories each day during your first trimester.  300 additional calories each day during your second trimester.  300 additional calories each day during your third trimester.  Eating a healthy, well-balanced diet is very important for your health and for your baby's health. You also have a higher need for some vitamins and minerals, such as folic acid, calcium, iron, and vitamin D. What do I need to know about eating during pregnancy?  Do not try to lose weight or go on a diet during pregnancy.  Choose healthy, nutritious foods. Choose  of a sandwich with a glass of milk instead of a candy bar or a high-calorie sugar-sweetened beverage.  Limit your overall intake of foods that have "empty calories." These are foods that have little nutritional value, such as sweets, desserts, candies, sugar-sweetened beverages, and fried foods.  Eat a variety of foods, especially fruits and vegetables.  Take a prenatal vitamin to help meet the additional needs during pregnancy, specifically for folic acid, iron, calcium, and vitamin D.  Remember to stay active. Ask your health care provider for exercise recommendations that are specific to you.  Practice good food safety and cleanliness, such as washing your hands before you eat and after you prepare raw meat. This helps to prevent foodborne illnesses, such as listeriosis, that can be very dangerous for your baby. Ask your health care provider for more information about listeriosis. What does 150 extra calories look like? Healthy options for an additional 150 calories each day could be any of the following:  Plain low-fat yogurt (6-8 oz) with  cup of berries.  1 apple with 2 teaspoons of peanut butter.  Cut-up vegetables with  cup of hummus.  Low-fat chocolate  milk (8 oz or 1 cup).  1 string cheese with 1 medium orange.   of a peanut butter and jelly sandwich on whole-wheat bread (1 tsp of peanut butter).  For 300 calories, you could eat two of those healthy options each day. What is a healthy amount of weight to gain? The recommended amount of weight for you to gain is based on your pre-pregnancy BMI. If your pre-pregnancy BMI was:  Less than 18 (underweight), you should gain 28-40 lb.  18-24.9 (normal), you should gain 25-35 lb.  25-29.9 (overweight), you should gain 15-25 lb.  Greater than 30 (obese), you should gain 11-20 lb.  What if I am having twins or multiples? Generally, pregnant women who will be having twins or multiples may need to increase their daily calories by 300-600 calories each day. The recommended range for total weight gain is 25-54 lb, depending on your pre-pregnancy BMI. Talk with your health care provider for specific guidance about additional nutritional needs, weight gain, and exercise during your pregnancy. What foods can I eat? Grains Any grains. Try to choose whole grains, such as whole-wheat bread, oatmeal, or brown rice. Vegetables Any vegetables. Try to eat a variety of colors and types of vegetables to get a full range of vitamins and minerals. Remember to wash your vegetables well before eating. Fruits Any fruits. Try to eat a variety of colors and types of fruit to get a full range of vitamins and minerals. Remember to wash your fruits well before eating. Meats and Other Protein Sources Lean meats, including chicken, turkey, fish, and lean cuts of beef, veal,   or pork. Make sure that all meats are cooked to "well done." Tofu. Tempeh. Beans. Eggs. Peanut butter and other nut butters. Seafood, such as shrimp, crab, and lobster. If you choose fish, select types that are higher in omega-3 fatty acids, including salmon, herring, mussels, trout, sardines, and pollock. Make sure that all meats are cooked to  food-safe temperatures. Dairy Pasteurized milk and milk alternatives. Pasteurized yogurt and pasteurized cheese. Cottage cheese. Sour cream. Beverages Water. Juices that contain 100% fruit juice or vegetable juice. Caffeine-free teas and decaffeinated coffee. Drinks that contain caffeine are okay to drink, but it is better to avoid caffeine. Keep your total caffeine intake to less than 200 mg each day (12 oz of coffee, tea, or soda) or as directed by your health care provider. Condiments Any pasteurized condiments. Sweets and Desserts Any sweets and desserts. Fats and Oils Any fats and oils. The items listed above may not be a complete list of recommended foods or beverages. Contact your dietitian for more options. What foods are not recommended? Vegetables Unpasteurized (raw) vegetable juices. Fruits Unpasteurized (raw) fruit juices. Meats and Other Protein Sources Cured meats that have nitrates, such as bacon, salami, and hotdogs. Luncheon meats, bologna, or other deli meats (unless they are reheated until they are steaming hot). Refrigerated pate, meat spreads from a meat counter, smoked seafood that is found in the refrigerated section of a store. Raw fish, such as sushi or sashimi. High mercury content fish, such as tilefish, shark, swordfish, and king mackerel. Raw meats, such as tuna or beef tartare. Undercooked meats and poultry. Make sure that all meats are cooked to food-safe temperatures. Dairy Unpasteurized (raw) milk and any foods that have raw milk in them. Soft cheeses, such as feta, queso blanco, queso fresco, Brie, Camembert cheeses, blue-veined cheeses, and Panela cheese (unless it is made with pasteurized milk, which must be stated on the label). Beverages Alcohol. Sugar-sweetened beverages, such as sodas, teas, or energy drinks. Condiments Homemade fermented foods and drinks, such as pickles, sauerkraut, or kombucha drinks. (Store-bought pasteurized versions of these are  okay.) Other Salads that are made in the store, such as ham salad, chicken salad, egg salad, tuna salad, and seafood salad. The items listed above may not be a complete list of foods and beverages to avoid. Contact your dietitian for more information. This information is not intended to replace advice given to you by your health care provider. Make sure you discuss any questions you have with your health care provider. Document Released: 08/03/2014 Document Revised: 03/26/2016 Document Reviewed: 04/03/2014 Elsevier Interactive Patient Education  2018 [employer] Inc[employer]   

## 2018-08-23 LAB — CERVICOVAGINAL ANCILLARY ONLY
Bacterial vaginitis: NEGATIVE
Candida vaginitis: NEGATIVE
Chlamydia: NEGATIVE
Neisseria Gonorrhea: NEGATIVE
Trichomonas: NEGATIVE

## 2018-08-25 LAB — URINE CULTURE, OB REFLEX

## 2018-08-25 LAB — CULTURE, OB URINE

## 2018-08-28 ENCOUNTER — Other Ambulatory Visit: Payer: Self-pay | Admitting: Internal Medicine

## 2018-08-29 LAB — OBSTETRIC PANEL, INCLUDING HIV
Antibody Screen: NEGATIVE
BASOS ABS: 0 10*3/uL (ref 0.0–0.2)
Basos: 0 %
EOS (ABSOLUTE): 0.1 10*3/uL (ref 0.0–0.4)
Eos: 1 %
HEP B S AG: NEGATIVE
HIV Screen 4th Generation wRfx: NONREACTIVE
Hematocrit: 38.1 % (ref 34.0–46.6)
Hemoglobin: 12.9 g/dL (ref 11.1–15.9)
IMMATURE GRANULOCYTES: 0 %
Immature Grans (Abs): 0 10*3/uL (ref 0.0–0.1)
Lymphocytes Absolute: 1.6 10*3/uL (ref 0.7–3.1)
Lymphs: 19 %
MCH: 30.4 pg (ref 26.6–33.0)
MCHC: 33.9 g/dL (ref 31.5–35.7)
MCV: 90 fL (ref 79–97)
MONOCYTES: 6 %
Monocytes Absolute: 0.5 10*3/uL (ref 0.1–0.9)
NEUTROS PCT: 74 %
Neutrophils Absolute: 6.2 10*3/uL (ref 1.4–7.0)
Platelets: 224 10*3/uL (ref 150–450)
RBC: 4.25 x10E6/uL (ref 3.77–5.28)
RDW: 12.1 % — ABNORMAL LOW (ref 12.3–15.4)
RPR: NONREACTIVE
RUBELLA: 16.1 {index} (ref >0.99)
Rh Factor: POSITIVE
WBC: 8.4 10*3/uL (ref 3.4–10.8)

## 2018-08-29 LAB — HEMOGLOBINOPATHY EVALUATION
HEMOGLOBIN A2 QUANTITATION: 2.5 % (ref 1.8–3.2)
HGB C: 0 %
HGB S: 0 %
HGB VARIANT: 0 %
Hemoglobin F Quantitation: 0 % (ref 0.0–2.0)
Hgb A: 97.5 % (ref 96.4–98.8)

## 2018-08-29 LAB — CYSTIC FIBROSIS MUTATION 97: GENE DIS ANAL CARRIER INTERP BLD/T-IMP: NOT DETECTED

## 2018-10-03 ENCOUNTER — Ambulatory Visit (INDEPENDENT_AMBULATORY_CARE_PROVIDER_SITE_OTHER): Payer: Medicaid Other | Admitting: Obstetrics and Gynecology

## 2018-10-03 ENCOUNTER — Other Ambulatory Visit: Payer: Self-pay

## 2018-10-03 ENCOUNTER — Encounter: Payer: Self-pay | Admitting: Obstetrics and Gynecology

## 2018-10-03 VITALS — BP 126/80 | HR 94 | Wt 154.9 lb

## 2018-10-03 DIAGNOSIS — Z3402 Encounter for supervision of normal first pregnancy, second trimester: Secondary | ICD-10-CM

## 2018-10-03 NOTE — Progress Notes (Signed)
   PRENATAL VISIT NOTE  Subjective:  Tiffany Bowman is a 27 y.o. G1P0 at 10 [redacted]w[redacted]d being seen today for ongoing prenatal care.  She is currently monitored for the following issues for this low-risk pregnancy and has Venereal wart; Lumbar herniated disc; Generalized anxiety disorder with panic attacks; Fear of flying; and Supervision of normal first pregnancy on their problem list.  Patient reports mild headaches which resolve with hydration, caffeine and rest.  Contractions: Not present. Vag. Bleeding: None.  Movement: Present. Denies leaking of fluid.   The following portions of the patient's history were reviewed and updated as appropriate: allergies, current medications, past family history, past medical history, past social history, past surgical history and problem list. Problem list updated.  Objective:   Vitals:   10/03/18 0919 10/03/18 0925  BP: 126/80 126/80  Pulse: 94 94  Weight: 154 lb 14.4 oz (70.3 kg) 154 lb 14.4 oz (70.3 kg)    Fetal Status: Fetal Heart Rate (bpm): 144   Movement: Present     General:  Alert, oriented and cooperative. Patient is in no acute distress.  Skin: Skin is warm and dry. No rash noted.   Cardiovascular: Normal heart rate noted  Respiratory: Normal respiratory effort, no problems with respiration noted  Abdomen: Soft, gravid, appropriate for gestational age.  Pain/Pressure: Absent     Pelvic: Cervical exam deferred        Extremities: Normal range of motion.  Edema: None  Mental Status: Normal mood and affect. Normal behavior. Normal judgment and thought content.   Assessment and Plan:  Pregnancy: G1P0 at 55 [redacted]w[redacted]d  1. Encounter for supervision of normal first pregnancy in second trimester Patient is doing well without complaints AFP today Anatomy ultrasound ordered   Preterm labor symptoms and general obstetric precautions including but not limited to vaginal bleeding, contractions, leaking of fluid and fetal movement were reviewed in detail  with the patient. Please refer to After Visit Summary for other counseling recommendations.  Return in about 4 weeks (around 10/31/2018) for ROB.  No future appointments.  Catalina AntiguaPeggy Maalle Starrett, MD

## 2018-10-03 NOTE — Progress Notes (Signed)
ROB.  C/o thick, white, cottage cheese discharge yesterday only, denies itching or itching.  Headaches 4/10 3-4/weekly.

## 2018-10-03 NOTE — Progress Notes (Signed)
Pt is here for ROB G1P0 [id number].

## 2018-10-06 LAB — AFP, SERUM, OPEN SPINA BIFIDA
AFP MOM: 1.07
AFP VALUE AFPOSL: 36.7 ng/mL
Gest. Age on Collection Date: 16.4 weeks
Maternal Age At EDD: 26.3 yr
OSBR Risk 1 IN: 9862
Test Results:: NEGATIVE
WEIGHT: 154 [lb_av]

## 2018-10-12 ENCOUNTER — Encounter (HOSPITAL_COMMUNITY): Payer: Self-pay

## 2018-10-17 ENCOUNTER — Telehealth: Payer: Self-pay

## 2018-10-17 NOTE — Telephone Encounter (Signed)
Pt called this am stating that yesterday she felt like she had flu like sx. She states that she is feeling much better today, but wanted to insure that having a fever would not affect her baby in any way. Pt advised to seek medical attention if sx return, but nothing to do at this point if she is feeling better

## 2018-10-20 ENCOUNTER — Ambulatory Visit (HOSPITAL_COMMUNITY)
Admission: RE | Admit: 2018-10-20 | Discharge: 2018-10-20 | Disposition: A | Discharge status: Home / Self Care | Payer: Medicaid Other | Source: Ambulatory Visit | Attending: Obstetrics and Gynecology | Admitting: Obstetrics and Gynecology

## 2018-10-20 DIAGNOSIS — Z3402 Encounter for supervision of normal first pregnancy, second trimester: Secondary | ICD-10-CM

## 2018-10-20 DIAGNOSIS — Z363 Encounter for antenatal screening for malformations: Secondary | ICD-10-CM | POA: Diagnosis not present

## 2018-10-20 DIAGNOSIS — Z3A19 19 weeks gestation of pregnancy: Secondary | ICD-10-CM | POA: Diagnosis not present

## 2018-10-31 ENCOUNTER — Other Ambulatory Visit: Payer: Self-pay

## 2018-10-31 ENCOUNTER — Ambulatory Visit (INDEPENDENT_AMBULATORY_CARE_PROVIDER_SITE_OTHER): Payer: Medicaid Other | Admitting: Obstetrics & Gynecology

## 2018-10-31 VITALS — BP 128/73 | HR 98 | Wt 152.8 lb

## 2018-10-31 DIAGNOSIS — Z3402 Encounter for supervision of normal first pregnancy, second trimester: Secondary | ICD-10-CM

## 2018-10-31 NOTE — Patient Instructions (Signed)

## 2018-10-31 NOTE — Progress Notes (Signed)
   PRENATAL VISIT NOTE  Subjective:  Tiffany Bowman is a 27 y.o. G1P0 at [redacted]w[redacted]d being seen today for ongoing prenatal care.  She is currently monitored for the following issues for this low-risk pregnancy and has Venereal wart; Lumbar herniated disc; Generalized anxiety disorder with panic attacks; Fear of flying; and Supervision of normal first pregnancy on their problem list.  Patient reports no complaints.  Contractions: Not present. Vag. Bleeding: None.  Movement: Present. Denies leaking of fluid.   The following portions of the patient's history were reviewed and updated as appropriate: allergies, current medications, past family history, past medical history, past social history, past surgical history and problem list. Problem list updated.  Objective:   Vitals:   10/31/18 1008  BP: 128/73  Pulse: 98  Weight: 152 lb 12.8 oz (69.3 kg)    Fetal Status: Fetal Heart Rate (bpm): 144 Fundal Height: 21 cm Movement: Present     General:  Alert, oriented and cooperative. Patient is in no acute distress.  Skin: Skin is warm and dry. No rash noted.   Cardiovascular: Normal heart rate noted  Respiratory: Normal respiratory effort, no problems with respiration noted  Abdomen: Soft, gravid, appropriate for gestational age.  Pain/Pressure: Present     Pelvic: Cervical exam deferred        Extremities: Normal range of motion.  Edema: None  Mental Status: Normal mood and affect. Normal behavior. Normal judgment and thought content.   Assessment and Plan:  Pregnancy: G1P0 at [redacted]w[redacted]d  There are no diagnoses linked to this encounter. Preterm labor symptoms and general obstetric precautions including but not limited to vaginal bleeding, contractions, leaking of fluid and fetal movement were reviewed in detail with the patient. Please refer to After Visit Summary for other counseling recommendations.  Return in about 4 weeks (around 11/28/2018).  Future Appointments  Date Time Provider  Department Center  11/28/2018  9:00 AM Brock BadHarper, Charles A, MD [facility] None    Scheryl DarterJames Jasline Buskirk, MD

## 2018-11-02 NOTE — L&D Delivery Note (Signed)
Delivery Note Pt progressed to complete at 1435 and pushed well. At 2:53 PM a viable female was delivered via Vaginal, Spontaneous (Presentation: LOA).  APGAR: 5, 8; weight: 4005 gm (8lb 13.3oz) . Nuchal cord x 1 reduced after head was delivered but before body delivered. Infant dried and placed on pt's abd; cord clamped and cut by FOB after 1 min delay; hospital cord blood sample collected.   Placenta status: spont, intact.  Cord: 3 vessel  Anesthesia:  Epidural Episiotomy: None Lacerations: 2nd degree;Perineal Suture Repair: 3.0 monocryl Est. Blood Loss (mL):  400  Mom to postpartum.  Baby to Couplet care / Skin to Skin- most likely; peds coming to eval due to retractions & needing blow by O2 during transition.  [name] CNM 03/10/2019, 3:39 PM  Please schedule this patient for Postpartum visit in: 1wk BP check, then 4 wks PP visit with the following provider: Any provider For C/S patients schedule nurse incision check in weeks 2 weeks: no High risk pregnancy complicated by: HTN Delivery mode:  SVD Anticipated Birth Control:  Paragard PP Procedures needed: BP check- 1 week Schedule Integrated BH visit: no

## 2018-11-28 ENCOUNTER — Encounter: Payer: Self-pay | Admitting: Obstetrics

## 2018-11-28 ENCOUNTER — Ambulatory Visit (INDEPENDENT_AMBULATORY_CARE_PROVIDER_SITE_OTHER): Payer: Medicaid Other | Admitting: Obstetrics

## 2018-11-28 VITALS — BP 134/77 | HR 111 | Wt 160.2 lb

## 2018-11-28 DIAGNOSIS — M549 Dorsalgia, unspecified: Secondary | ICD-10-CM

## 2018-11-28 DIAGNOSIS — Z3482 Encounter for supervision of other normal pregnancy, second trimester: Secondary | ICD-10-CM

## 2018-11-28 DIAGNOSIS — Z3480 Encounter for supervision of other normal pregnancy, unspecified trimester: Secondary | ICD-10-CM

## 2018-11-28 MED ORDER — COMFORT FIT MATERNITY SUPP SM MISC: 0 refills | Status: DC

## 2018-11-28 NOTE — Progress Notes (Signed)
Subjective:  Tiffany Bowman is a 28 y.o. G1P0 at [redacted]w[redacted]d being seen today for ongoing prenatal care.  She is currently monitored for the following issues for this low-risk pregnancy and has Venereal wart; Lumbar herniated disc; Generalized anxiety disorder with panic attacks; Fear of flying; and Supervision of normal first pregnancy on their problem list.  Patient reports backache.  Contractions: Not present. Vag. Bleeding: None.  Movement: Present. Denies leaking of fluid.   The following portions of the patient's history were reviewed and updated as appropriate: allergies, current medications, past family history, past medical history, past social history, past surgical history and problem list. Problem list updated.  Objective:   Vitals:   11/28/18 0908  BP: 134/77  Pulse: (!) 111  Weight: 160 lb 3.2 oz (72.7 kg)    Fetal Status:     Movement: Present     General:  Alert, oriented and cooperative. Patient is in no acute distress.  Skin: Skin is warm and dry. No rash noted.   Cardiovascular: Normal heart rate noted  Respiratory: Normal respiratory effort, no problems with respiration noted  Abdomen: Soft, gravid, appropriate for gestational age. Pain/Pressure: Present     Pelvic:  Cervical exam deferred        Extremities: Normal range of motion.  Edema: None  Mental Status: Normal mood and affect. Normal behavior. Normal judgment and thought content.   Urinalysis:      Assessment and Plan:  Pregnancy: G1P0 at [redacted]w[redacted]d  1. Supervision of other normal pregnancy, antepartum   Preterm labor symptoms and general obstetric precautions including but not limited to vaginal bleeding, contractions, leaking of fluid and fetal movement were reviewed in detail with the patient. Please refer to After Visit Summary for other counseling recommendations.  Return in about 3 weeks (around 12/19/2018) for ROB, 2 hour OGTT.   Brock Bad, MD

## 2018-11-28 NOTE — Patient Instructions (Signed)
Tests and Screening During Pregnancy Having certain tests and screenings during pregnancy is an important part of your prenatal care. These tests help your health care provider find problems that might affect your pregnancy. Some tests are done for all pregnant women, and some are optional. Most of the tests and screenings do not pose any risks for you or your baby. You may need additional testing if any routine tests indicate a problem. Tests and screenings done in early pregnancy Some tests and screenings you can expect to have in early pregnancy include:  Blood tests, such as: ? Complete blood count (CBC). This test is done to check your red and white blood cells. It can help identify a risk for anemia, infection, or bleeding. ? Blood typing. This test determines your blood type as well as whether you have a certain protein in your red blood cells (Rh factor). If you do not have this protein (Rh negative) and your baby does have it (Rh positive), your body could make antibodies to the Rh factor. This could be dangerous to your baby's health. ? Tests to check for diseases that can cause birth defects or can be passed to your baby, such as:  [place] measles (rubella). The test indicates whether you are immune to rubella.  Hepatitis B and C. All women are tested for hepatitis B. You may also be tested for hepatitis C if you have risk factors for the condition.  Zika virus infection. You may have a blood or urine test to check for this infection if you or your partner has traveled to an area where the virus occurs.  Urine testing. A urine sample can be tested for diabetes, protein in your urine, and signs of infection.  Testing for sexually transmitted infections (STIs), such as HIV, syphilis, and chlamydia.  Testing for tuberculosis. You may have this skin test if you are at risk for tuberculosis.  Fetal ultrasound. This is an imaging study of your developing baby. It is done using sound waves  and a computer. This test may be done at 11-14 weeks to confirm your pregnancy and help determine your due date. Tests and screenings done later in pregnancy Certain tests are done for the first time during later pregnancy. In addition, some of the tests that were done in early pregnancy are repeated at this time. Some common tests you can expect to have later in pregnancy include:  Rh antibody testing. If you are Rh negative, you will have a blood test at about 28 weeks of pregnancy to see if you are producing Rh antibodies. If you have not started to make antibodies, you will be given an injection to prevent you from making antibodies for the rest of your pregnancy.  Glucose screening. This tests your blood sugar to find out whether you are developing the type of diabetes that occurs during pregnancy (gestational diabetes). You may have this screening earlier if you have risk factors for diabetes.  Screening for group B streptococcus (GBS). GBS is a type of bacteria that may live in your rectum or vagina. You may have GBS without any symptoms. GBS can spread to your baby during birth. This test involves doing a rectal and vaginal swab at 35-37 weeks of pregnancy. If testing is positive for GBS, you may be treated with antibiotic medicine.  CBC to check for anemia and blood-clotting ability.  Urine tests to check for protein, which can be a sign of a condition called preeclampsia.  Fetal ultrasound. This  may be repeated at 16-20 weeks to check how your baby is growing and developing. Screening for birth defects Some birth defects are caused by abnormal genes passed down through families. Early in your pregnancy, tests can be done to find out if your baby is at risk for a genetic disorder. This testing is optional. The type of testing recommended for you will depend on your family and medical history, your ethnicity, and your age. Testing may include:  Screening tests. These tests may include an  ultrasound, blood tests, or a combination of both. The blood tests are used to check for abnormal genes, and the ultrasound is done to look for early birth defects.  Carrier screening. This test involves checking the blood or saliva of both parents to see if they carry abnormal genes that could be passed down to a baby. If genetic screening shows that your baby is at risk for a genetic defect, additional diagnostic testing may be recommended, such as:  Amniocentesis. This involves testing a sample of fluid from your womb (amniotic fluid).  Chorionic villus sampling. In this test, a sample of cells from your placenta is checked for abnormal cells. Unlike other tests done during pregnancy, diagnostic testing does have some risk for your pregnancy. Talk to your health care provider about the risks and benefits of genetic testing. Where to find more information  American Pregnancy Association: americanpregnancy.org/prenatal-testing  Office on Women's Health: [employer].[place]  March of Dimes: marchofdimes.org/pregnancy Questions to ask your health care provider  What routine tests are recommended for me?  When and how will these tests be done?  When will I get the results of routine tests?  What do the results of these tests mean for me or my baby?  Do you recommend any genetic screening tests? Which ones?  Should I see a genetic counselor before having genetic screening? Summary  Having tests and screenings during pregnancy is an important part of your prenatal care.  In early pregnancy, testing may be done to check blood type, Rh status, and risks for various conditions that can affect your baby.  Fetal ultrasound may be done in early pregnancy to confirm a pregnancy and later to look for any birth defects.  Later in pregnancy, tests may include screening for GBS and gestational diabetes.  Genetic testing is optional. Consider talking to a genetic counselor about this  testing. This information is not intended to replace advice given to you by your health care provider. Make sure you discuss any questions you have with your health care provider. Document Released: 01/03/2018 Document Revised: 01/03/2018 Document Reviewed: 01/03/2018 Elsevier Interactive Patient Education  2019 [employer] Inc.  Glucose Tolerance Test During Pregnancy Why am I having this test? The glucose tolerance test (GTT) is done to check how your body processes sugar (glucose). This is one of several tests used to diagnose diabetes that develops during pregnancy (gestational diabetes mellitus). Gestational diabetes is a temporary form of diabetes that some women develop during pregnancy. It usually occurs during the second trimester of pregnancy and goes away after delivery. Testing (screening) for gestational diabetes usually occurs between 24 and 28 weeks of pregnancy. You may have the GTT test after having a 1-hour glucose screening test if the results from that test indicate that you may have gestational diabetes. You may also have this test if:  You have a history of gestational diabetes.  You have a history of giving birth to very large babies or have experienced repeated fetal loss (  stillbirth).  You have signs and symptoms of diabetes, such as: ? Changes in your vision. ? Tingling or numbness in your hands or feet. ? Changes in hunger, thirst, and urination that are not otherwise explained by your pregnancy. What is being tested? This test measures the amount of glucose in your blood at different times during a period of 3 hours. This indicates how well your body is able to process glucose. What kind of sample is taken?  Blood samples are required for this test. They are usually collected by inserting a needle into a blood vessel. How do I prepare for this test?  For 3 days before your test, eat normally. Have plenty of carbohydrate-rich foods.  Follow instructions from your  health care provider about: ? Eating or drinking restrictions on the day of the test. You may be asked to not eat or drink anything other than water (fast) starting 8-10 hours before the test. ? Changing or stopping your regular medicines. Some medicines may interfere with this test. Tell a health care provider about:  All medicines you are taking, including vitamins, herbs, eye drops, creams, and over-the-counter medicines.  Any blood disorders you have.  Any surgeries you have had.  Any medical conditions you have. What happens during the test? First, your blood glucose will be measured. This is referred to as your fasting blood glucose, since you fasted before the test. Then, you will drink a glucose solution that contains a certain amount of glucose. Your blood glucose will be measured again 1, 2, and 3 hours after drinking the solution. This test takes about 3 hours to complete. You will need to stay at the testing location during this time. During the testing period:  Do not eat or drink anything other than the glucose solution.  Do not exercise.  Do not use any products that contain nicotine or tobacco, such as cigarettes and e-cigarettes. If you need help stopping, ask your health care provider. The testing procedure may vary among health care providers and hospitals. How are the results reported? Your results will be reported as milligrams of glucose per deciliter of blood (mg/dL) or millimoles per liter (mmol/L). Your health care provider will compare your results to normal ranges that were established after testing a large group of people (reference ranges). Reference ranges may vary among labs and hospitals. For this test, common reference ranges are:  Fasting: less than 95-105 mg/dL (6.5-6.8 mmol/L).  1 hour after drinking glucose: less than 180-190 mg/dL (12.7-51.7 mmol/L).  2 hours after drinking glucose: less than 155-165 mg/dL (0.0-1.7 mmol/L).  3 hours after drinking  glucose: 140-145 mg/dL (4.9-4.4 mmol/L). What do the results mean? Results within reference ranges are considered normal, meaning that your glucose levels are well-controlled. If two or more of your blood glucose levels are high, you may be diagnosed with gestational diabetes. If only one level is high, your health care provider may suggest repeat testing or other tests to confirm a diagnosis. Talk with your health care provider about what your results mean. Questions to ask your health care provider Ask your health care provider, or the department that is doing the test:  When will my results be ready?  How will I get my results?  What are my treatment options?  What other tests do I need?  What are my next steps? Summary  The glucose tolerance test (GTT) is one of several tests used to diagnose diabetes that develops during pregnancy (gestational diabetes mellitus). Gestational  diabetes is a temporary form of diabetes that some women develop during pregnancy.  You may have the GTT test after having a 1-hour glucose screening test if the results from that test indicate that you may have gestational diabetes. You may also have this test if you have any symptoms or risk factors for gestational diabetes.  Talk with your health care provider about what your results mean. This information is not intended to replace advice given to you by your health care provider. Make sure you discuss any questions you have with your health care provider. Document Released: 04/19/2012 Document Revised: 05/31/2017 Document Reviewed: 05/31/2017 Elsevier Interactive Patient Education  2019 [employer].

## 2018-11-28 NOTE — Progress Notes (Signed)
Pt presents for ROB c/o bilateral leg cramping.

## 2018-12-19 ENCOUNTER — Ambulatory Visit (INDEPENDENT_AMBULATORY_CARE_PROVIDER_SITE_OTHER): Payer: Medicaid Other | Admitting: Obstetrics

## 2018-12-19 ENCOUNTER — Other Ambulatory Visit: Payer: Medicaid Other

## 2018-12-19 ENCOUNTER — Encounter: Payer: Self-pay | Admitting: Obstetrics

## 2018-12-19 VITALS — BP 111/71 | HR 93 | Wt 160.0 lb

## 2018-12-19 DIAGNOSIS — Z3480 Encounter for supervision of other normal pregnancy, unspecified trimester: Secondary | ICD-10-CM

## 2018-12-19 DIAGNOSIS — Z3483 Encounter for supervision of other normal pregnancy, third trimester: Secondary | ICD-10-CM

## 2018-12-19 DIAGNOSIS — Z3A28 28 weeks gestation of pregnancy: Secondary | ICD-10-CM

## 2018-12-19 NOTE — Progress Notes (Signed)
Subjective:  Tiffany Bowman is a 28 y.o. G1P0 at [redacted]w[redacted]d being seen today for ongoing prenatal care.  She is currently monitored for the following issues for this low-risk pregnancy and has Venereal wart; Lumbar herniated disc; Generalized anxiety disorder with panic attacks; Fear of flying; and Supervision of normal first pregnancy on their problem list.  Patient reports restless legs.  Contractions: Not present. Vag. Bleeding: None.  Movement: Present. Denies leaking of fluid.   The following portions of the patient's history were reviewed and updated as appropriate: allergies, current medications, past family history, past medical history, past social history, past surgical history and problem list. Problem list updated.  Objective:   Vitals:   12/19/18 0855  BP: 111/71  Pulse: 93  Weight: 160 lb (72.6 kg)    Fetal Status: Fetal Heart Rate (bpm): 145   Movement: Present     General:  Alert, oriented and cooperative. Patient is in no acute distress.  Skin: Skin is warm and dry. No rash noted.   Cardiovascular: Normal heart rate noted  Respiratory: Normal respiratory effort, no problems with respiration noted  Abdomen: Soft, gravid, appropriate for gestational age. Pain/Pressure: Absent     Pelvic:  Cervical exam deferred        Extremities: Normal range of motion.     Mental Status: Normal mood and affect. Normal behavior. Normal judgment and thought content.   Urinalysis:      Assessment and Plan:  Pregnancy: G1P0 at [redacted]w[redacted]d  1. Supervision of other normal pregnancy, antepartum Rx: - Glucose Tolerance, 2 Hours w/1 Hour - CBC - HIV Antibody (routine testing w rflx) - RPR  There are no diagnoses linked to this encounter. Preterm labor symptoms and general obstetric precautions including but not limited to vaginal bleeding, contractions, leaking of fluid and fetal movement were reviewed in detail with the patient. Please refer to After Visit Summary for other counseling  recommendations.  Return in about 2 weeks (around 01/02/2019) for ROB.   Brock Bad, MD

## 2018-12-20 LAB — CBC
Hematocrit: 33.4 % — ABNORMAL LOW (ref 34.0–46.6)
Hemoglobin: 10.9 g/dL — ABNORMAL LOW (ref 11.1–15.9)
MCH: 28.9 pg (ref 26.6–33.0)
MCHC: 32.6 g/dL (ref 31.5–35.7)
MCV: 89 fL (ref 79–97)
PLATELETS: 209 10*3/uL (ref 150–450)
RBC: 3.77 x10E6/uL (ref 3.77–5.28)
RDW: 12.3 % (ref 11.7–15.4)
WBC: 7.5 10*3/uL (ref 3.4–10.8)

## 2018-12-20 LAB — GLUCOSE TOLERANCE, 2 HOURS W/ 1HR
GLUCOSE, FASTING: 79 mg/dL (ref 65–91)
Glucose, 1 hour: 129 mg/dL (ref 65–179)
Glucose, 2 hour: 77 mg/dL (ref 65–152)

## 2018-12-20 LAB — HIV ANTIBODY (ROUTINE TESTING W REFLEX): HIV Screen 4th Generation wRfx: NONREACTIVE

## 2018-12-20 LAB — RPR: RPR Ser Ql: NONREACTIVE

## 2018-12-21 ENCOUNTER — Other Ambulatory Visit: Payer: Self-pay | Admitting: Obstetrics

## 2018-12-21 DIAGNOSIS — O99019 Anemia complicating pregnancy, unspecified trimester: Secondary | ICD-10-CM

## 2018-12-21 MED ORDER — FERROUS SULFATE 325 (65 FE) MG PO TABS: 325.0000 mg | ORAL_TABLET | Freq: Two times a day (BID) | ORAL | 5 refills | Status: DC

## 2018-12-25 ENCOUNTER — Inpatient Hospital Stay (HOSPITAL_COMMUNITY)
Admission: AD | Admit: 2018-12-25 | Discharge: 2018-12-25 | Disposition: A | Discharge status: Home / Self Care | Payer: Managed Care, Other (non HMO) | Attending: Obstetrics & Gynecology | Admitting: Obstetrics & Gynecology

## 2018-12-25 ENCOUNTER — Encounter (HOSPITAL_COMMUNITY): Payer: Self-pay | Admitting: *Deleted

## 2018-12-25 ENCOUNTER — Other Ambulatory Visit: Payer: Self-pay

## 2018-12-25 DIAGNOSIS — O26893 Other specified pregnancy related conditions, third trimester: Secondary | ICD-10-CM | POA: Diagnosis not present

## 2018-12-25 DIAGNOSIS — Z3A29 29 weeks gestation of pregnancy: Secondary | ICD-10-CM | POA: Diagnosis not present

## 2018-12-25 DIAGNOSIS — Z0371 Encounter for suspected problem with amniotic cavity and membrane ruled out: Secondary | ICD-10-CM

## 2018-12-25 LAB — POCT FERN TEST: POCT FERN TEST: NEGATIVE

## 2018-12-25 LAB — URINALYSIS, ROUTINE W REFLEX MICROSCOPIC
Bilirubin Urine: NEGATIVE
GLUCOSE, UA: NEGATIVE mg/dL
HGB URINE DIPSTICK: NEGATIVE
Ketones, ur: NEGATIVE mg/dL
Nitrite: NEGATIVE
Protein, ur: NEGATIVE mg/dL
SPECIFIC GRAVITY, URINE: 1.011 (ref 1.005–1.030)
pH: 9 — ABNORMAL HIGH (ref 5.0–8.0)

## 2018-12-25 NOTE — [facility] Note (Signed)
Pt presents to [facility] with complaints of leakage of fluid since last night., continues to leak today. Denies any VB. +FM

## 2018-12-25 NOTE — Discharge Instructions (Signed)
Warning Signs During Pregnancy °A pregnancy lasts about 40 weeks, starting from the first day of your last period until the baby is born. Pregnancy is divided into three phases called trimesters. °· The first trimester refers to week 1 through week 13 of pregnancy. °· The second trimester is the start of week 14 through the end of week 27. °· The third trimester is the start of week 28 until you deliver your baby. °During each trimester of pregnancy, certain signs and symptoms may indicate a problem. Talk with your health care provider about your current health and any medical conditions you have. Make sure you know the symptoms that you should watch for and report. °How does this affect me? ° °Warning signs in the first trimester °While some changes during the first trimester may be uncomfortable, most do not represent a serious problem. Let your health care provider know if you have any of the following warning signs in the first trimester: °· You cannot eat or drink without vomiting, and this lasts for longer than a day. °· You have vaginal bleeding or spotting along with menstrual-like cramping. °· You have diarrhea for longer than a day. °· You have a fever or other signs of infection, such as: °? Pain or burning when you urinate. °? Foul smelling or thick or yellowish vaginal discharge. °Warning signs in the second trimester °As your baby grows and changes during the second trimester, there are additional signs and symptoms that may indicate a problem. These include: °· Signs and symptoms of infection, including a fever. °· Signs or symptoms of a miscarriage or preterm labor, such as regular contractions, menstrual-like cramping, or lower abdominal pain. °· Bloody or watery vaginal discharge or obvious vaginal bleeding. °· Feeling like your heart is pounding. °· Having trouble breathing. °· Nausea, vomiting, or diarrhea that lasts for longer than a day. °· Craving non-food items, such as clay, chalk, or dirt.  This may be a sign of a very treatable medical condition called pica. °Later in your second trimester, watch for signs and symptoms of a serious medical condition called preeclampsia.These include: °· Changes in your vision. °· A severe headache that does not go away. °· Nausea and vomiting. °It is also important to notice if your baby stops moving or moves less than usual during this time. °Warning signs in the third trimester °As you approach the third trimester, your baby is growing and your body is preparing for the birth of your baby. In your third trimester, be sure to let your health care provider know if: °· You have signs and symptoms of infection, including a fever. °· You have vaginal bleeding. °· You notice that your baby is moving less than usual or is not moving. °· You have nausea, vomiting, or diarrhea that lasts for longer than a day. °· You have a severe headache that does not go away. °· You have vision changes, including seeing spots or having blurry or double vision. °· You have increased swelling in your hands or face. °How does this affect my baby? °Throughout your pregnancy, always report any of the warning signs of a problem to your health care provider. This can help prevent complications that may affect your baby, including: °· Increased risk for premature birth. °· Infection that may be transmitted to your baby. °· Increased risk for stillbirth. °Contact a health care provider if: °· You have any of the warning signs of a problem for the current trimester of your pregnancy. °·   Any of the following apply to you during any trimester of pregnancy: °? You have strong emotions, such as sadness or anxiety, that interfere with work or personal relationships. °? You feel unsafe in your home and need help finding a safe place to live. °? You are using tobacco products, alcohol, or drugs and you need help to stop. °Get help right away if: °You have signs or symptoms of labor before 37 weeks of  pregnancy. These include: °· Contractions that are 5 minutes or less apart, or that increase in frequency, intensity, or length. °· Sudden, sharp abdominal pain or low back pain. °· Uncontrolled gush or trickle of fluid from your vagina. °Summary °· A pregnancy lasts about 40 weeks, starting from the first day of your last period until the baby is born. Pregnancy is divided into three phases called trimesters. Each trimester has warning signs to watch for. °· Always report any warning signs to your health care provider in order to prevent complications that may affect both you and your baby. °· Talk with your health care provider about your current health and any medical conditions you have. Make sure you know the symptoms that you should watch for and report. °This information is not intended to replace advice given to you by your health care provider. Make sure you discuss any questions you have with your health care provider. °Document Released: 08/05/2017 Document Revised: 08/05/2017 Document Reviewed: 08/05/2017 °Elsevier Interactive Patient Education © 2019 Elsevier Inc. ° ° ° ° °Fetal Movement Counts °Patient Name: ________________________________________________ Patient Due Date: ____________________ °What is a fetal movement count? ° °A fetal movement count is the number of times that you feel your baby move during a certain amount of time. This may also be called a fetal kick count. A fetal movement count is recommended for every pregnant woman. You may be asked to start counting fetal movements as early as week 28 of your pregnancy. °Pay attention to when your baby is most active. You may notice your baby's sleep and wake cycles. You may also notice things that make your baby move more. You should do a fetal movement count: °· When your baby is normally most active. °· At the same time each day. °A good time to count movements is while you are resting, after having something to eat and drink. °How do I  count fetal movements? °1. Find a quiet, comfortable area. Sit, or lie down on your side. °2. Write down the date, the start time and stop time, and the number of movements that you felt between those two times. Take this information with you to your health care visits. °3. For 2 hours, count kicks, flutters, swishes, rolls, and jabs. You should feel at least 10 movements during 2 hours. °4. You may stop counting after you have felt 10 movements. °5. If you do not feel 10 movements in 2 hours, have something to eat and drink. Then, keep resting and counting for 1 hour. If you feel at least 4 movements during that hour, you may stop counting. °Contact a health care provider if: °· You feel fewer than 4 movements in 2 hours. °· Your baby is not moving like he or she usually does. °Date: ____________ Start time: ____________ Stop time: ____________ Movements: ____________ °Date: ____________ Start time: ____________ Stop time: ____________ Movements: ____________ °Date: ____________ Start time: ____________ Stop time: ____________ Movements: ____________ °Date: ____________ Start time: ____________ Stop time: ____________ Movements: ____________ °Date: ____________ Start time: ____________ Stop time:   ____________ Movements: ____________ °Date: ____________ Start time: ____________ Stop time: ____________ Movements: ____________ °Date: ____________ Start time: ____________ Stop time: ____________ Movements: ____________ °Date: ____________ Start time: ____________ Stop time: ____________ Movements: ____________ °Date: ____________ Start time: ____________ Stop time: ____________ Movements: ____________ °This information is not intended to replace advice given to you by your health care provider. Make sure you discuss any questions you have with your health care provider. °Document Released: 11/18/2006 Document Revised: 06/17/2016 Document Reviewed: 11/28/2015 °Elsevier Interactive Patient Education © 2019 Elsevier  Inc. ° °

## 2018-12-25 NOTE — [facility] Provider Note (Signed)
First Provider Initiated Contact with Patient 12/25/18 1124       S: Ms. Tiffany Bowman is a 28 y.o. G2P0010 at [redacted]w[redacted]d  who presents to [facility] today complaining of leaking of fluid since last night. She denies vaginal bleeding. She denies contractions. She reports normal fetal movement. No recent intercourse.   O: BP 130/66   Pulse 95   Temp 98.7 F (37.1 C)   Resp 16   Ht 5\' 5"  (1.651 m)   Wt 72 kg   LMP 06/09/2018 (Approximate)   BMI 26.41 kg/m  GENERAL: Well-developed, well-nourished female in no acute distress.  HEAD: Normocephalic, atraumatic.  CHEST: Normal effort of breathing, regular heart rate ABDOMEN: Soft, nontender, gravid PELVIC: Normal external female genitalia. Vagina is pink and rugated. Cervix with normal contour, no lesions. Normal discharge.  No pooling.   Cervical exam: visually closed  Fetal Monitoring: Baseline: 135 Variability: moderate Accelerations: 10x10 Decelerations: mild variable decel x 1 Contractions: none  Results for orders placed or performed during the hospital encounter of 12/25/18 (from the past 24 hour(s))  Urinalysis, Routine w reflex microscopic     Status: Abnormal   Collection Time: 12/25/18 11:03 AM  Result Value Ref Range   Color, Urine YELLOW YELLOW   APPearance HAZY (A) CLEAR   Specific Gravity, Urine 1.011 1.005 - 1.030   pH 9.0 (H) 5.0 - 8.0   Glucose, UA NEGATIVE NEGATIVE mg/dL   Hgb urine dipstick NEGATIVE NEGATIVE   Bilirubin Urine NEGATIVE NEGATIVE   Ketones, ur NEGATIVE NEGATIVE mg/dL   Protein, ur NEGATIVE NEGATIVE mg/dL   Nitrite NEGATIVE NEGATIVE   Leukocytes,Ua TRACE (A) NEGATIVE   RBC / HPF 0-5 0 - 5 RBC/hpf   WBC, UA 0-5 0 - 5 WBC/hpf   Bacteria, UA RARE (A) NONE SEEN   Squamous Epithelial / LPF 0-5 0 - 5  POCT fern test     Status: None   Collection Time: 12/25/18 11:27 AM  Result Value Ref Range   POCT Fern Test Negative = intact amniotic membranes      A: [facility] at [redacted]w[redacted]d  Membranes  intact  P: Discharge home in stable condition  Discussed reasons to return to [facility]U Keep f/u with Virgia Land, NP 12/25/2018 11:39 AM

## 2019-01-02 ENCOUNTER — Other Ambulatory Visit: Payer: Self-pay

## 2019-01-02 ENCOUNTER — Ambulatory Visit (INDEPENDENT_AMBULATORY_CARE_PROVIDER_SITE_OTHER): Payer: Managed Care, Other (non HMO) | Admitting: Obstetrics

## 2019-01-02 ENCOUNTER — Encounter: Payer: Self-pay | Admitting: Obstetrics

## 2019-01-02 VITALS — BP 121/67 | HR 104 | Wt 165.3 lb

## 2019-01-02 DIAGNOSIS — Z23 Encounter for immunization: Secondary | ICD-10-CM | POA: Diagnosis not present

## 2019-01-02 DIAGNOSIS — Z3483 Encounter for supervision of other normal pregnancy, third trimester: Secondary | ICD-10-CM

## 2019-01-02 DIAGNOSIS — Z3480 Encounter for supervision of other normal pregnancy, unspecified trimester: Secondary | ICD-10-CM

## 2019-01-02 DIAGNOSIS — Z3A30 30 weeks gestation of pregnancy: Secondary | ICD-10-CM

## 2019-01-02 NOTE — Progress Notes (Signed)
TDAP given in LD, tolerated well. °

## 2019-01-02 NOTE — Progress Notes (Signed)
Subjective:  Tiffany Bowman is a 28 y.o. G2P0010 at [redacted]w[redacted]d being seen today for ongoing prenatal care.  She is currently monitored for the following issues for this low-risk pregnancy and has Venereal wart; Lumbar herniated disc; Generalized anxiety disorder with panic attacks; Fear of flying; and Supervision of normal first pregnancy on their problem list.  Patient reports no complaints.  Contractions: Not present. Vag. Bleeding: None.  Movement: Present. Denies leaking of fluid.   The following portions of the patient's history were reviewed and updated as appropriate: allergies, current medications, past family history, past medical history, past social history, past surgical history and problem list. Problem list updated.  Objective:   Vitals:   01/02/19 0937  BP: 121/67  Pulse: (!) 104  Weight: 165 lb 4.8 oz (75 kg)    Fetal Status:     Movement: Present     General:  Alert, oriented and cooperative. Patient is in no acute distress.  Skin: Skin is warm and dry. No rash noted.   Cardiovascular: Normal heart rate noted  Respiratory: Normal respiratory effort, no problems with respiration noted  Abdomen: Soft, gravid, appropriate for gestational age. Pain/Pressure: Absent     Pelvic:  Cervical exam deferred        Extremities: Normal range of motion.  Edema: None  Mental Status: Normal mood and affect. Normal behavior. Normal judgment and thought content.   Urinalysis:      Assessment and Plan:  Pregnancy: G2P0010 at [redacted]w[redacted]d  1. Supervision of other normal pregnancy, antepartum  2. SGA (small for gestational age) Rx: - [place] MFM OB FOLLOW UP; Future  Preterm labor symptoms and general obstetric precautions including but not limited to vaginal bleeding, contractions, leaking of fluid and fetal movement were reviewed in detail with the patient. Please refer to After Visit Summary for other counseling recommendations.  Return in about 2 weeks (around 01/16/2019) for  ROB.   Brock Bad, MD

## 2019-01-18 ENCOUNTER — Encounter: Payer: Managed Care, Other (non HMO) | Admitting: Obstetrics and Gynecology

## 2019-01-18 ENCOUNTER — Ambulatory Visit (HOSPITAL_COMMUNITY): Payer: Managed Care, Other (non HMO)

## 2019-01-20 ENCOUNTER — Ambulatory Visit (HOSPITAL_COMMUNITY)
Admission: RE | Admit: 2019-01-20 | Discharge: 2019-01-20 | Disposition: A | Discharge status: Home / Self Care | Payer: Managed Care, Other (non HMO) | Source: Ambulatory Visit | Attending: Obstetrics and Gynecology | Admitting: Obstetrics and Gynecology

## 2019-01-20 ENCOUNTER — Other Ambulatory Visit: Payer: Self-pay

## 2019-01-20 ENCOUNTER — Ambulatory Visit (INDEPENDENT_AMBULATORY_CARE_PROVIDER_SITE_OTHER): Payer: Managed Care, Other (non HMO) | Admitting: Obstetrics and Gynecology

## 2019-01-20 ENCOUNTER — Encounter: Payer: Self-pay | Admitting: Obstetrics and Gynecology

## 2019-01-20 VITALS — BP 122/76 | HR 96 | Wt 163.0 lb

## 2019-01-20 DIAGNOSIS — Z3A32 32 weeks gestation of pregnancy: Secondary | ICD-10-CM | POA: Diagnosis not present

## 2019-01-20 DIAGNOSIS — O365990 Maternal care for other known or suspected poor fetal growth, unspecified trimester, not applicable or unspecified: Secondary | ICD-10-CM

## 2019-01-20 DIAGNOSIS — Z362 Encounter for other antenatal screening follow-up: Secondary | ICD-10-CM

## 2019-01-20 DIAGNOSIS — Z3403 Encounter for supervision of normal first pregnancy, third trimester: Secondary | ICD-10-CM

## 2019-01-20 DIAGNOSIS — Z3A33 33 weeks gestation of pregnancy: Secondary | ICD-10-CM

## 2019-01-20 NOTE — Progress Notes (Signed)
   PRENATAL VISIT NOTE  Subjective:  Tiffany Bowman is a 28 y.o. G2P0010 at [redacted]w[redacted]d being seen today for ongoing prenatal care.  She is currently monitored for the following issues for this low-risk pregnancy and has Venereal wart; Lumbar herniated disc; Generalized anxiety disorder with panic attacks; Fear of flying; and Supervision of normal first pregnancy on their problem list.  Patient reports no complaints.  Contractions: Not present. Vag. Bleeding: None.  Movement: Present. Denies leaking of fluid.   The following portions of the patient's history were reviewed and updated as appropriate: allergies, current medications, past family history, past medical history, past social history, past surgical history and problem list.   Objective:   Vitals:   01/20/19 0907  BP: 122/76  Pulse: 96  Weight: 163 lb (73.9 kg)    Fetal Status: Fetal Heart Rate (bpm): 142 Fundal Height: 32 cm Movement: Present     General:  Alert, oriented and cooperative. Patient is in no acute distress.  Skin: Skin is warm and dry. No rash noted.   Cardiovascular: Normal heart rate noted  Respiratory: Normal respiratory effort, no problems with respiration noted  Abdomen: Soft, gravid, appropriate for gestational age.  Pain/Pressure: Absent     Pelvic: Cervical exam deferred        Extremities: Normal range of motion.  Edema: None  Mental Status: Normal mood and affect. Normal behavior. Normal judgment and thought content.   Assessment and Plan:  Pregnancy: G2P0010 at [redacted]w[redacted]d 1. Encounter for supervision of normal first pregnancy in third trimester Patient is doing well without complaints Follow up growth ultrasound performed today- report not yet available  Preterm labor symptoms and general obstetric precautions including but not limited to vaginal bleeding, contractions, leaking of fluid and fetal movement were reviewed in detail with the patient. Please refer to After Visit Summary for other counseling  recommendations.   Return in about 2 weeks (around 02/03/2019) for ROB.  No future appointments.  Catalina Antigua, MD

## 2019-01-20 NOTE — Progress Notes (Signed)
Pt is here for ROB. [redacted]w[redacted]d.

## 2019-02-02 ENCOUNTER — Encounter: Payer: Self-pay | Admitting: Obstetrics and Gynecology

## 2019-02-02 ENCOUNTER — Other Ambulatory Visit: Payer: Self-pay

## 2019-02-02 ENCOUNTER — Ambulatory Visit (INDEPENDENT_AMBULATORY_CARE_PROVIDER_SITE_OTHER): Payer: Managed Care, Other (non HMO) | Admitting: Obstetrics and Gynecology

## 2019-02-02 DIAGNOSIS — Z3A35 35 weeks gestation of pregnancy: Secondary | ICD-10-CM

## 2019-02-02 DIAGNOSIS — Z3403 Encounter for supervision of normal first pregnancy, third trimester: Secondary | ICD-10-CM

## 2019-02-02 NOTE — Progress Notes (Signed)
   TELEHEALTH VIRTUAL OBSTETRICS VISIT ENCOUNTER NOTE  I connected with Tiffany Bowman on 02/02/19 at  8:15 AM EDT by telephone at home and verified that I am speaking with the correct person using two identifiers.   I discussed the limitations, risks, security and privacy concerns of performing an evaluation and management service by telephone and the availability of in person appointments. I also discussed with the patient that there may be a patient responsible charge related to this service. The patient expressed understanding and agreed to proceed.  Subjective:  Tiffany Bowman is a 28 y.o. G2P0010 at [redacted]w[redacted]d being followed for ongoing prenatal care.  She is currently monitored for the following issues for this low-risk pregnancy and has Venereal wart; Lumbar herniated disc; Generalized anxiety disorder with panic attacks; Fear of flying; and Supervision of normal first pregnancy on their problem list.  Patient reports backache. Also fatigue at the end of the day. Reports fetal movement. Denies any contractions, bleeding or leaking of fluid. Does has some discharge. Reports overall she is feeling "great."  The following portions of the patient's history were reviewed and updated as appropriate: allergies, current medications, past family history, past medical history, past social history, past surgical history and problem list.   Objective:   General:  Alert, oriented and cooperative.   Mental Status: Normal mood and affect perceived. Normal judgment and thought content.  Rest of physical exam deferred due to type of encounter  Assessment and Plan:  Pregnancy: G2P0010 at [redacted]w[redacted]d  1. Encounter for supervision of normal first pregnancy in third trimester No issues Had a bad experience with the ultrasound tech, felt she was weird and wasn't given any pictures.  Considering paraguard, reviewed risks/benefits  Preterm labor symptoms and general obstetric precautions including but not  limited to vaginal bleeding, contractions, leaking of fluid and fetal movement were reviewed in detail with the patient.  I discussed the assessment and treatment plan with the patient. The patient was provided an opportunity to ask questions and all were answered. The patient agreed with the plan and demonstrated an understanding of the instructions. The patient was advised to call back or seek an in-person office evaluation/go to MAU at [facility] for any urgent or concerning symptoms. Please refer to After Visit Summary for other counseling recommendations.   I provided 14 minutes of non-face-to-face time during this encounter.  Return in about 1 week (around 02/09/2019).  No future appointments.  Conan Bowens, MD Center for [facility], [facility]

## 2019-02-02 NOTE — Progress Notes (Signed)
Televisit ROB.  Reports no problems today.

## 2019-02-09 ENCOUNTER — Other Ambulatory Visit (HOSPITAL_COMMUNITY)
Admission: RE | Admit: 2019-02-09 | Discharge: 2019-02-09 | Disposition: A | Discharge status: Home / Self Care | Payer: Managed Care, Other (non HMO) | Source: Ambulatory Visit | Attending: Obstetrics & Gynecology | Admitting: Obstetrics & Gynecology

## 2019-02-09 ENCOUNTER — Other Ambulatory Visit: Payer: Self-pay

## 2019-02-09 ENCOUNTER — Ambulatory Visit (INDEPENDENT_AMBULATORY_CARE_PROVIDER_SITE_OTHER): Payer: Managed Care, Other (non HMO) | Admitting: Obstetrics & Gynecology

## 2019-02-09 VITALS — BP 128/85 | HR 98 | Wt 166.0 lb

## 2019-02-09 DIAGNOSIS — Z3A36 36 weeks gestation of pregnancy: Secondary | ICD-10-CM

## 2019-02-09 DIAGNOSIS — O99343 Other mental disorders complicating pregnancy, third trimester: Secondary | ICD-10-CM

## 2019-02-09 DIAGNOSIS — Z3403 Encounter for supervision of normal first pregnancy, third trimester: Secondary | ICD-10-CM | POA: Diagnosis not present

## 2019-02-09 DIAGNOSIS — F410 Panic disorder [episodic paroxysmal anxiety] without agoraphobia: Secondary | ICD-10-CM

## 2019-02-09 DIAGNOSIS — F411 Generalized anxiety disorder: Secondary | ICD-10-CM

## 2019-02-09 MED ORDER — PAROXETINE HCL 10 MG PO TABS: 10.0000 mg | ORAL_TABLET | Freq: Every day | ORAL | 1 refills | Status: DC

## 2019-02-09 NOTE — Progress Notes (Signed)
   PRENATAL VISIT NOTE  Subjective:  Tiffany Bowman is a 28 y.o. G2P0010 at [redacted]w[redacted]d being seen today for ongoing prenatal care.  She is currently monitored for the following issues for this low-risk pregnancy and has Venereal wart; Lumbar herniated disc; Generalized anxiety disorder with panic attacks; Fear of flying; and Supervision of normal first pregnancy on their problem list.  Patient reports anxiety is increasing, asks to restart Paxil .  Contractions: Not present. Vag. Bleeding: None.  Movement: Present. Denies leaking of fluid.   The following portions of the patient's history were reviewed and updated as appropriate: allergies, current medications, past family history, past medical history, past social history, past surgical history and problem list.   Objective:   Vitals:   02/09/19 1126  BP: 128/85  Pulse: 98  Weight: 166 lb (75.3 kg)    Fetal Status: Fetal Heart Rate (bpm): 145 Fundal Height: 34 cm Movement: Present  Presentation: Vertex  General:  Alert, oriented and cooperative. Patient is in no acute distress.  Skin: Skin is warm and dry. No rash noted.   Cardiovascular: Normal heart rate noted  Respiratory: Normal respiratory effort, no problems with respiration noted  Abdomen: Soft, gravid, appropriate for gestational age.  Pain/Pressure: Absent     Pelvic: Cervical exam performed Dilation: 1 Effacement (%): 10 Station: Ballotable  Extremities: Normal range of motion.     Mental Status: Normal mood and affect. Normal behavior. Normal judgment and thought content.   Assessment and Plan:  Pregnancy: G2P0010 at [redacted]w[redacted]d 1. Encounter for supervision of normal first pregnancy in third trimester Routine tests - Strep Gp B NAA - Cervicovaginal ancillary only( [place])  2. Generalized anxiety disorder with panic attacks Restart her medication - PARoxetine (PAXIL) 10 MG tablet; Take 1 tablet (10 mg total) by mouth daily.  Dispense: 90 tablet; Refill: 1  Preterm  labor symptoms and general obstetric precautions including but not limited to vaginal bleeding, contractions, leaking of fluid and fetal movement were reviewed in detail with the patient. Please refer to After Visit Summary for other counseling recommendations.   Return in about 1 week (around 02/16/2019) for tele.  Future Appointments  Date Time Provider Department Center  02/16/2019 11:15 AM Adam Phenix, MD [facility] None    Scheryl Darter, MD

## 2019-02-09 NOTE — Patient Instructions (Signed)
Paroxetine tablets What is this medicine? PAROXETINE (pa ROX e teen) is used to treat depression. It may also be used to treat anxiety disorders, obsessive compulsive disorder, panic attacks, post traumatic stress, and premenstrual dysphoric disorder (PMDD). This medicine may be used for other purposes; ask your health care provider or pharmacist if you have questions. COMMON BRAND NAME(S): Paxil, Pexeva What should I tell my health care provider before I take this medicine? They need to know if you have any of these conditions: -bipolar disorder or a family history of bipolar disorder -bleeding disorders -glaucoma -heart disease -kidney disease -liver disease -low levels of sodium in the blood -seizures -suicidal thoughts, plans, or attempt; a previous suicide attempt by you or a family member -take MAOIs like Carbex, Eldepryl, Marplan, Nardil, and Parnate -take medicines that treat or prevent blood clots -thyroid disease -an unusual or allergic reaction to paroxetine, other medicines, foods, dyes, or preservatives -pregnant or trying to get pregnant -breast-feeding How should I use this medicine? Take this medicine by mouth with a glass of water. Follow the directions on the prescription label. You can take it with or without food. Take your medicine at regular intervals. Do not take your medicine more often than directed. Do not stop taking this medicine suddenly except upon the advice of your doctor. Stopping this medicine too quickly may cause serious side effects or your condition may worsen. A special MedGuide will be given to you by the pharmacist with each prescription and refill. Be sure to read this information carefully each time. Talk to your pediatrician regarding the use of this medicine in children. Special care may be needed. Overdosage: If you think you have taken too much of this medicine contact a poison control center or emergency room at once. NOTE: This medicine is  only for you. Do not share this medicine with others. What if I miss a dose? If you miss a dose, take it as soon as you can. If it is almost time for your next dose, take only that dose. Do not take double or extra doses. What may interact with this medicine? Do not take this medicine with any of the following medications: -linezolid -MAOIs like Carbex, Eldepryl, Marplan, Nardil, and Parnate -methylene blue (injected into a vein) -pimozide -thioridazine This medicine may also interact with the following medications: -alcohol -amphetamines -aspirin and aspirin-like medicines -atomoxetine -certain medicines for depression, anxiety, or psychotic disturbances -certain medicines for irregular heart beat like propafenone, flecainide, encainide, and quinidine -certain medicines for migraine headache like almotriptan, eletriptan, frovatriptan, naratriptan, rizatriptan, sumatriptan, zolmitriptan -cimetidine -digoxin -diuretics -fentanyl -fosamprenavir -furazolidone -isoniazid -lithium -medicines that treat or prevent blood clots like warfarin, enoxaparin, and dalteparin -medicines for sleep -NSAIDs, medicines for pain and inflammation, like ibuprofen or naproxen -phenobarbital -phenytoin -procarbazine -rasagiline -ritonavir -supplements like St. John's wort, kava kava, valerian -tamoxifen -tramadol -tryptophan This list may not describe all possible interactions. Give your health care provider a list of all the medicines, herbs, non-prescription drugs, or dietary supplements you use. Also tell them if you smoke, drink alcohol, or use illegal drugs. Some items may interact with your medicine. What should I watch for while using this medicine? Tell your doctor if your symptoms do not get better or if they get worse. Visit your doctor or health care professional for regular checks on your progress. Because it may take several weeks to see the full effects of this medicine, it is important  to continue your treatment as prescribed by your doctor.  medicine.  What should I watch for while using this medicine?  Tell your doctor if your symptoms do not get better or if they get worse. Visit your doctor or health care professional for regular checks on your progress. Because it may take several weeks to see the full effects of this medicine, it is important  to continue your treatment as prescribed by your doctor.  Patients and their families should watch out for new or worsening thoughts of suicide or depression. Also watch out for sudden changes in feelings such as feeling anxious, agitated, panicky, irritable, hostile, aggressive, impulsive, severely restless, overly excited and hyperactive, or not being able to sleep. If this happens, especially at the beginning of treatment or after a change in dose, call your health care professional.  You may get drowsy or dizzy. Do not drive, use machinery, or do anything that needs mental alertness until you know how this medicine affects you. Do not stand or sit up quickly, especially if you are an older patient. This reduces the risk of dizzy or fainting spells. Alcohol may interfere with the effect of this medicine. Avoid alcoholic drinks.  Your mouth may get dry. Chewing sugarless gum or sucking hard candy, and drinking plenty of water will help. Contact your doctor if the problem does not go away or is severe.  What side effects may I notice from receiving this medicine?  Side effects that you should report to your doctor or health care professional as soon as possible:  -allergic reactions like skin rash, itching or hives, swelling of the face, lips, or tongue  -anxious  -black, tarry stools  -changes in vision  -confusion  -elevated mood, decreased need for sleep, racing thoughts, impulsive behavior  -eye pain  -fast, irregular heartbeat  -feeling faint or lightheaded, falls  -feeling agitated, angry, or irritable  -hallucination, loss of contact with reality  -loss of balance or coordination  -loss of memory  -painful or prolonged erections  -restlessness, pacing, inability to keep still  -seizures  -stiff muscles  -suicidal thoughts or other mood changes  -trouble sleeping  -unusual bleeding or bruising  -unusually weak or tired  -vomiting  Side effects that usually do not require medical attention (report to your  doctor or health care professional if they continue or are bothersome):  -change in appetite or weight  -change in sex drive or performance  -diarrhea  -dizziness  -dry mouth  -increased sweating  -indigestion, nausea  -tired  -tremors  This list may not describe all possible side effects. Call your doctor for medical advice about side effects. You may report side effects to FDA at 1-800-FDA-1088.  Where should I keep my medicine?  Keep out of the reach of children.  Store at room temperature between 15 and 30 degrees C (59 and 86 degrees F). Keep container tightly closed. Throw away any unused medicine after the expiration date.  NOTE: This sheet is a summary. It may not cover all possible information. If you have questions about this medicine, talk to your doctor, pharmacist, or health care provider.   2019 Elsevier/Gold Standard (2016-03-21 15:50:32)

## 2019-02-11 LAB — STREP GP B NAA: Strep Gp B NAA: NEGATIVE

## 2019-02-13 LAB — CERVICOVAGINAL ANCILLARY ONLY
Chlamydia: NEGATIVE
Neisseria Gonorrhea: NEGATIVE

## 2019-02-16 ENCOUNTER — Ambulatory Visit (INDEPENDENT_AMBULATORY_CARE_PROVIDER_SITE_OTHER): Payer: Managed Care, Other (non HMO) | Admitting: Obstetrics

## 2019-02-16 ENCOUNTER — Encounter: Payer: Self-pay | Admitting: Obstetrics

## 2019-02-16 ENCOUNTER — Other Ambulatory Visit: Payer: Self-pay

## 2019-02-16 DIAGNOSIS — Z3A37 37 weeks gestation of pregnancy: Secondary | ICD-10-CM

## 2019-02-16 DIAGNOSIS — Z3403 Encounter for supervision of normal first pregnancy, third trimester: Secondary | ICD-10-CM

## 2019-02-16 NOTE — Progress Notes (Signed)
   TELEHEALTH VIRTUAL OBSTETRICS VISIT ENCOUNTER NOTE  I connected with Tiffany Bowman on 02/16/19 at 11:15 AM EDT by telephone at home and verified that I am speaking with the correct person using two identifiers.   I discussed the limitations, risks, security and privacy concerns of performing an evaluation and management service by telephone and the availability of in person appointments. I also discussed with the patient that there may be a patient responsible charge related to this service. The patient expressed understanding and agreed to proceed.  Subjective:  Tiffany Bowman is a 28 y.o. G2P0010 at [redacted]w[redacted]d being followed for ongoing prenatal care.  She is currently monitored for the following issues for this low-risk pregnancy and has Venereal wart; Lumbar herniated disc; Generalized anxiety disorder with panic attacks; Fear of flying; and Supervision of normal first pregnancy on their problem list.  Patient reports heartburn. Reports fetal movement. Denies any contractions, bleeding or leaking of fluid.   The following portions of the patient's history were reviewed and updated as appropriate: allergies, current medications, past family history, past medical history, past social history, past surgical history and problem list.   Objective:   General:  Alert, oriented and cooperative.   Mental Status: Normal mood and affect perceived. Normal judgment and thought content.  Rest of physical exam deferred due to type of encounter  Assessment and Plan:  Pregnancy: G2P0010 at [redacted]w[redacted]d 1. Encounter for supervision of normal first pregnancy in third trimester Rx: - Babyscripts Schedule Optimization  Term labor symptoms and general obstetric precautions including but not limited to vaginal bleeding, contractions, leaking of fluid and fetal movement were reviewed in detail with the patient.  I discussed the assessment and treatment plan with the patient. The patient was provided an  opportunity to ask questions and all were answered. The patient agreed with the plan and demonstrated an understanding of the instructions. The patient was advised to call back or seek an in-person office evaluation/go to MAU at [facility] for any urgent or concerning symptoms. Please refer to After Visit Summary for other counseling recommendations.   I provided 9 minutes of non-face-to-face time during this encounter.  No follow-ups on file.  No future appointments.  Coral Ceo, MD Center for [facility], [facility] 02-16-2019

## 2019-02-16 NOTE — Progress Notes (Signed)
S/w pt via tele visit, pt reports fetal movement, denies pain. Pt states that she does not have BP cuff at the moment because she is at work, pt states that she can take it later and log it.

## 2019-02-22 ENCOUNTER — Telehealth: Payer: Self-pay | Admitting: *Deleted

## 2019-02-22 NOTE — Telephone Encounter (Signed)
Pt called to office to report her BP since she can't get it to log into babyRx. Pt BP today is 141/79.  Pt states BP a bit higher than her normal.  Pt denies any HA, visual changes or changes is swelling.  Pt advised to check BP tomorrow with WebVisit. Pt made aware that if she becomes symptomatic to be seen at [facility] at [facility].  Pt states understanding.

## 2019-02-23 ENCOUNTER — Ambulatory Visit (INDEPENDENT_AMBULATORY_CARE_PROVIDER_SITE_OTHER): Payer: Managed Care, Other (non HMO) | Admitting: Obstetrics

## 2019-02-23 ENCOUNTER — Encounter: Payer: Self-pay | Admitting: Obstetrics

## 2019-02-23 ENCOUNTER — Other Ambulatory Visit: Payer: Self-pay

## 2019-02-23 VITALS — BP 113/72 | Wt 168.4 lb

## 2019-02-23 DIAGNOSIS — F411 Generalized anxiety disorder: Secondary | ICD-10-CM

## 2019-02-23 DIAGNOSIS — Z3A38 38 weeks gestation of pregnancy: Secondary | ICD-10-CM

## 2019-02-23 DIAGNOSIS — F410 Panic disorder [episodic paroxysmal anxiety] without agoraphobia: Secondary | ICD-10-CM

## 2019-02-23 DIAGNOSIS — Z3480 Encounter for supervision of other normal pregnancy, unspecified trimester: Secondary | ICD-10-CM

## 2019-02-23 DIAGNOSIS — Z3483 Encounter for supervision of other normal pregnancy, third trimester: Secondary | ICD-10-CM

## 2019-02-23 NOTE — Progress Notes (Signed)
   TELEHEALTH [facility] VIRTUAL OBSTETRICS VISIT ENCOUNTER NOTE  I connected with Tiffany Bowman on 02/23/19 at 11:15 AM EDT by [facility] at home and verified that I am speaking with the correct person using two identifiers.   I discussed the limitations, risks, security and privacy concerns of performing an evaluation and management service by telephone and the availability of in person appointments. I also discussed with the patient that there may be a patient responsible charge related to this service. The patient expressed understanding and agreed to proceed.  Subjective:  Tiffany Bowman is a 28 y.o. G2P0010 at [redacted]w[redacted]d being followed for ongoing prenatal care.  She is currently monitored for the following issues for this low-risk pregnancy and has Venereal wart; Lumbar herniated disc; Generalized anxiety disorder with panic attacks; Fear of flying; and Supervision of normal first pregnancy on their problem list.  Patient reports no complaints. Reports fetal movement. Denies any contractions, bleeding or leaking of fluid.   The following portions of the patient's history were reviewed and updated as appropriate: allergies, current medications, past family history, past medical history, past social history, past surgical history and problem list.   Objective:   General:  Alert, oriented and cooperative.   Mental Status: Normal mood and affect perceived. Normal judgment and thought content.  Rest of physical exam deferred due to type of encounter  Assessment and Plan:  Pregnancy: G2P0010 at [redacted]w[redacted]d 1. Supervision of other normal pregnancy, antepartum  2. Generalized anxiety disorder with panic attacks - stable clinically, off meds  Term labor symptoms and general obstetric precautions including but not limited to vaginal bleeding, contractions, leaking of fluid and fetal movement were reviewed in detail with the patient.  I discussed the assessment and treatment plan with the patient. The  patient was provided an opportunity to ask questions and all were answered. The patient agreed with the plan and demonstrated an understanding of the instructions. The patient was advised to call back or seek an in-person office evaluation/go to MAU at [facility] for any urgent or concerning symptoms. Please refer to After Visit Summary for other counseling recommendations.   I provided 10 minutes of non-face-to-face time during this encounter.  Return in about 1 week (around 03/02/2019) for ROB.    Coral Ceo, MD Center for [facility], [facility] 02-23-2019

## 2019-02-23 NOTE — Progress Notes (Signed)
Webex ROB: Pt has concerns today.

## 2019-03-02 ENCOUNTER — Other Ambulatory Visit: Payer: Self-pay

## 2019-03-02 ENCOUNTER — Encounter: Payer: Self-pay | Admitting: Obstetrics and Gynecology

## 2019-03-02 ENCOUNTER — Ambulatory Visit (INDEPENDENT_AMBULATORY_CARE_PROVIDER_SITE_OTHER): Payer: Managed Care, Other (non HMO) | Admitting: Obstetrics and Gynecology

## 2019-03-02 ENCOUNTER — Telehealth (HOSPITAL_COMMUNITY): Payer: Self-pay | Admitting: *Deleted

## 2019-03-02 VITALS — BP 133/83 | HR 101 | Wt 172.0 lb

## 2019-03-02 DIAGNOSIS — Z3A39 39 weeks gestation of pregnancy: Secondary | ICD-10-CM

## 2019-03-02 DIAGNOSIS — Z3403 Encounter for supervision of normal first pregnancy, third trimester: Secondary | ICD-10-CM

## 2019-03-02 NOTE — Addendum Note (Signed)
Addended by: Leroy Libman on: 03/02/2019 09:42 AM   Modules accepted: Orders

## 2019-03-02 NOTE — Telephone Encounter (Signed)
Preadmission screen  

## 2019-03-02 NOTE — Progress Notes (Signed)
   PRENATAL VISIT NOTE  Subjective:  Tiffany Bowman is a 28 y.o. G2P0010 at [redacted]w[redacted]d being seen today for ongoing prenatal care.  She is currently monitored for the following issues for this low-risk pregnancy and has Venereal wart; Lumbar herniated disc; Generalized anxiety disorder with panic attacks; Fear of flying; and Supervision of normal first pregnancy on their problem list.  Patient reports backache.  Contractions: Not present. Vag. Bleeding: None.  Movement: Present. Denies leaking of fluid.   The following portions of the patient's history were reviewed and updated as appropriate: allergies, current medications, past family history, past medical history, past social history, past surgical history and problem list.   Objective:   Vitals:   03/02/19 0814  BP: 133/83  Pulse: (!) 101  Weight: 172 lb (78 kg)    Fetal Status:     Movement: Present     General:  Alert, oriented and cooperative. Patient is in no acute distress.  Skin: Skin is warm and dry. No rash noted.   Cardiovascular: Normal heart rate noted  Respiratory: Normal respiratory effort, no problems with respiration noted  Abdomen: Soft, gravid, appropriate for gestational age.  Pain/Pressure: Absent     Pelvic: Cervical exam performed      2/20/-3  Extremities: Normal range of motion.  Edema: Trace  Mental Status: Normal mood and affect. Normal behavior. Normal judgment and thought content.   Assessment and Plan:  Pregnancy: G2P0010 at [redacted]w[redacted]d  1. Encounter for supervision of normal first pregnancy in third trimester Doing well, no issues Reviewed reasons to present to MAU Induction scheduled for 41 weeks  Term labor symptoms and general obstetric precautions including but not limited to vaginal bleeding, contractions, leaking of fluid and fetal movement were reviewed in detail with the patient. Please refer to After Visit Summary for other counseling recommendations.   Return in about 1 week (around 03/09/2019)  for OB visit.  No future appointments.  Conan Bowens, MD

## 2019-03-02 NOTE — Progress Notes (Signed)
ROB with complaints of back pain and hip pain.  Pt notes she has not started Paxil.  Pt has cuff at home and is checking B/P  Pt request cervix check.

## 2019-03-03 ENCOUNTER — Telehealth (HOSPITAL_COMMUNITY): Payer: Self-pay | Admitting: *Deleted

## 2019-03-03 NOTE — Telephone Encounter (Signed)
Preadmission screen  

## 2019-03-06 ENCOUNTER — Telehealth (HOSPITAL_COMMUNITY): Payer: Self-pay | Admitting: *Deleted

## 2019-03-06 ENCOUNTER — Encounter (HOSPITAL_COMMUNITY): Payer: Self-pay | Admitting: *Deleted

## 2019-03-06 ENCOUNTER — Other Ambulatory Visit: Payer: Self-pay | Admitting: Advanced Practice Midwife

## 2019-03-06 NOTE — Telephone Encounter (Signed)
Preadmission screen  

## 2019-03-09 ENCOUNTER — Ambulatory Visit (EMERGENCY_DEPARTMENT_HOSPITAL)
Admission: RE | Admit: 2019-03-09 | Discharge: 2019-03-09 | Disposition: A | Discharge status: Home / Self Care | Payer: Managed Care, Other (non HMO) | Source: Ambulatory Visit | Attending: Obstetrics and Gynecology | Admitting: Obstetrics and Gynecology

## 2019-03-09 ENCOUNTER — Encounter: Payer: Self-pay | Admitting: Certified Nurse Midwife

## 2019-03-09 ENCOUNTER — Inpatient Hospital Stay (HOSPITAL_COMMUNITY)
Admission: AD | Admit: 2019-03-09 | Discharge: 2019-03-11 | DRG: 807 | Disposition: A | Discharge status: Home / Self Care | Payer: Managed Care, Other (non HMO) | Attending: Obstetrics and Gynecology | Admitting: Obstetrics and Gynecology

## 2019-03-09 ENCOUNTER — Encounter (HOSPITAL_COMMUNITY): Payer: Self-pay

## 2019-03-09 ENCOUNTER — Other Ambulatory Visit: Payer: Self-pay

## 2019-03-09 ENCOUNTER — Ambulatory Visit (INDEPENDENT_AMBULATORY_CARE_PROVIDER_SITE_OTHER): Payer: Managed Care, Other (non HMO) | Admitting: Certified Nurse Midwife

## 2019-03-09 VITALS — BP 128/85 | HR 101 | Temp 97.4°F | Wt 174.8 lb

## 2019-03-09 DIAGNOSIS — Z3A40 40 weeks gestation of pregnancy: Secondary | ICD-10-CM | POA: Diagnosis not present

## 2019-03-09 DIAGNOSIS — O134 Gestational [pregnancy-induced] hypertension without significant proteinuria, complicating childbirth: Principal | ICD-10-CM | POA: Diagnosis present

## 2019-03-09 DIAGNOSIS — O480 Post-term pregnancy: Secondary | ICD-10-CM

## 2019-03-09 DIAGNOSIS — Z3403 Encounter for supervision of normal first pregnancy, third trimester: Secondary | ICD-10-CM

## 2019-03-09 DIAGNOSIS — O99344 Other mental disorders complicating childbirth: Secondary | ICD-10-CM | POA: Diagnosis present

## 2019-03-09 DIAGNOSIS — F411 Generalized anxiety disorder: Secondary | ICD-10-CM | POA: Diagnosis present

## 2019-03-09 DIAGNOSIS — F410 Panic disorder [episodic paroxysmal anxiety] without agoraphobia: Secondary | ICD-10-CM | POA: Diagnosis present

## 2019-03-09 DIAGNOSIS — O139 Gestational [pregnancy-induced] hypertension without significant proteinuria, unspecified trimester: Secondary | ICD-10-CM | POA: Diagnosis present

## 2019-03-09 LAB — COMPREHENSIVE METABOLIC PANEL
ALT: 21 U/L (ref 0–44)
AST: 26 U/L (ref 15–41)
Albumin: 3 g/dL — ABNORMAL LOW (ref 3.5–5.0)
Alkaline Phosphatase: 129 U/L — ABNORMAL HIGH (ref 38–126)
Anion gap: 9 (ref 5–15)
BUN: 5 mg/dL — ABNORMAL LOW (ref 6–20)
CO2: 22 mmol/L (ref 22–32)
Calcium: 8.8 mg/dL — ABNORMAL LOW (ref 8.9–10.3)
Chloride: 103 mmol/L (ref 98–111)
Creatinine, Ser: 0.63 mg/dL (ref 0.44–1.00)
GFR calc Af Amer: >60 mL/min (ref >60)
GFR calc non Af Amer: >60 mL/min (ref >60)
Glucose, Bld: 88 mg/dL (ref 70–99)
Potassium: 3.8 mmol/L (ref 3.5–5.1)
Sodium: 134 mmol/L — ABNORMAL LOW (ref 135–145)
Total Bilirubin: 0.5 mg/dL (ref 0.3–1.2)
Total Protein: 6.1 g/dL — ABNORMAL LOW (ref 6.5–8.1)

## 2019-03-09 LAB — CBC
HCT: 35.9 % — ABNORMAL LOW (ref 36.0–46.0)
Hemoglobin: 11.8 g/dL — ABNORMAL LOW (ref 12.0–15.0)
MCH: 29.8 pg (ref 26.0–34.0)
MCHC: 32.9 g/dL (ref 30.0–36.0)
MCV: 90.7 fL (ref 80.0–100.0)
Platelets: 159 10*3/uL (ref 150–400)
RBC: 3.96 MIL/uL (ref 3.87–5.11)
RDW: 13.1 % (ref 11.5–15.5)
WBC: 8.8 10*3/uL (ref 4.0–10.5)
nRBC: 0 % (ref 0.0–0.2)

## 2019-03-09 LAB — PROTEIN / CREATININE RATIO, URINE
Creatinine, Urine: 54.52 mg/dL
Protein Creatinine Ratio: 0.15 mg/mg{Cre} (ref 0.00–0.15)
Total Protein, Urine: 8 mg/dL

## 2019-03-09 LAB — TYPE AND SCREEN
ABO/RH(D): A POS
Antibody Screen: NEGATIVE

## 2019-03-09 LAB — ABO/RH: ABO/RH(D): A POS

## 2019-03-09 MED ORDER — OXYCODONE-ACETAMINOPHEN 5-325 MG PO TABS: 1.0000 | ORAL_TABLET | ORAL | Status: DC | PRN

## 2019-03-09 MED ORDER — ONDANSETRON HCL 4 MG/2ML IJ SOLN: 4.0000 mg | Freq: Four times a day (QID) | INTRAMUSCULAR | Status: DC | PRN

## 2019-03-09 MED ORDER — OXYTOCIN 40 UNITS IN NORMAL SALINE INFUSION - SIMPLE MED
1.0000 m[IU]/min | INTRAVENOUS | Status: DC
  Administered 2019-03-10: 2 m[IU]/min via INTRAVENOUS

## 2019-03-09 MED ORDER — TERBUTALINE SULFATE 1 MG/ML IJ SOLN: 0.2500 mg | Freq: Once | INTRAMUSCULAR | Status: DC | PRN

## 2019-03-09 MED ORDER — SOD CITRATE-CITRIC ACID 500-334 MG/5ML PO SOLN: 30.0000 mL | ORAL | Status: DC | PRN

## 2019-03-09 MED ORDER — OXYCODONE-ACETAMINOPHEN 5-325 MG PO TABS: 2.0000 | ORAL_TABLET | ORAL | Status: DC | PRN

## 2019-03-09 MED ORDER — ACETAMINOPHEN 325 MG PO TABS
650.0000 mg | ORAL_TABLET | ORAL | Status: DC | PRN
  Administered 2019-03-09: 22:00:00 650 mg via ORAL
  Filled 2019-03-09: qty 2

## 2019-03-09 MED ORDER — MISOPROSTOL 25 MCG QUARTER TABLET
25.0000 ug | ORAL_TABLET | ORAL | Status: DC | PRN
  Administered 2019-03-09: 15:00:00 25 ug via VAGINAL
  Filled 2019-03-09: qty 1

## 2019-03-09 MED ORDER — OXYTOCIN 40 UNITS IN NORMAL SALINE INFUSION - SIMPLE MED
2.5000 [IU]/h | INTRAVENOUS | Status: DC
  Filled 2019-03-09: qty 1000

## 2019-03-09 MED ORDER — LACTATED RINGERS IV SOLN
INTRAVENOUS | Status: DC
  Administered 2019-03-09 – 2019-03-10 (×3): via INTRAVENOUS

## 2019-03-09 MED ORDER — OXYTOCIN BOLUS FROM INFUSION
500.0000 mL | Freq: Once | INTRAVENOUS | Status: AC
  Administered 2019-03-10: 15:00:00 500 mL via INTRAVENOUS

## 2019-03-09 MED ORDER — LIDOCAINE HCL (PF) 1 % IJ SOLN: 30.0000 mL | INTRAMUSCULAR | Status: DC | PRN

## 2019-03-09 MED ORDER — LACTATED RINGERS IV SOLN: 500.0000 mL | INTRAVENOUS | Status: DC | PRN

## 2019-03-09 NOTE — H&P (Signed)
Tiffany Bowman is a 28 y.o. female presenting for IOL for new onset Gestational Hypertension. She received prenatal care at [facility] and her dating is based on LMP. Upon admission she denies vaginal bleeding, leaking of fluid, decreased fetal movement, fever, falls, or recent illness.    She denies severe signs or symptoms upon admission.  OB History    Gravida  2   Para  0   Term  0   Preterm  0   AB  1   Living  0     SAB  0   TAB  1   Ectopic  0   Multiple  0   Live Births  0          Past Medical History:  Diagnosis Date  . Anxiety   . Vaginal Pap smear, abnormal    Past Surgical History:  Procedure Laterality Date  . WISDOM TOOTH EXTRACTION     Family History: family history includes Anxiety disorder in her sister; Cancer in her paternal grandmother; Diabetes in her maternal grandmother; Glaucoma in her father; Hypertension in her mother. Social History:  reports that she has never smoked. She has never used smokeless tobacco. She reports previous alcohol use of about 8.0 standard drinks of alcohol per week. She reports that she does not use drugs.     Maternal Diabetes: No Genetic Screening: Normal Maternal Ultrasounds/Referrals: Normal Fetal Ultrasounds or other Referrals:  None Maternal Substance Abuse:  No Significant Maternal Medications:  None Significant Maternal Lab Results:  None Other Comments:  None  Review of Systems  Constitutional: Negative for fever.  Eyes: Negative for blurred vision, double vision and photophobia.  Respiratory: Negative for cough and shortness of breath.   Gastrointestinal: Negative for abdominal pain.  Musculoskeletal: Negative for back pain.  All other systems reviewed and are negative.    Last menstrual period 06/09/2018.  Physical Exam  Nursing note and vitals reviewed. Constitutional: She is oriented to person, place, and time. She appears well-developed and well-nourished.  Cardiovascular: Normal  rate.  Respiratory: Effort normal and breath sounds normal. No accessory muscle usage. No respiratory distress.  GI: There is no abdominal tenderness.  Gravid  Neurological: She is alert and oriented to person, place, and time.  Skin: Skin is warm and dry.  Psychiatric: She has a normal mood and affect. Her behavior is normal. Judgment and thought content normal.    Prenatal labs: ABO, Rh: A/Positive/-- (10/21 1118) Antibody: Negative (10/21 1118) Rubella: 16.10 (10/21 1118) RPR: Non Reactive (02/17 1025)  HBsAg: Negative (10/21 1118)  HIV: Non Reactive (02/17 1025)  GBS: Negative (04/09 0104)   Assessment/Plan: --Category I tracing: baseline 135, moderate variability, positive accels, no decels --Toco: irregular ctx q 2-6 min, not felt by patient --Foley bulb and 25 mcg Cytotec (PV) placed. Tolerated well by patient --Normotensive on admission, no severe signs of symptoms, PEC labs collected --Plans for epidural in active labor  Girl/breast/Paragard  Calvert Cantor, CNM 03/09/2019, 3:45 PM

## 2019-03-09 NOTE — Progress Notes (Signed)
Tiffany Bowman is a 28 y.o. G2P0010 at [redacted]w[redacted]d admitted for induction of labor due to new onset gestational hypertension.  Subjective: Patient reports improvement in contraction pain. Now rates pain as variable and ranging from 1-4/10.  Objective: BP 139/74   Pulse 95   Temp 98.9 F (37.2 C) (Oral)   Resp 16   Ht 5\' 5"  (1.651 m)   Wt 80.2 kg   LMP 06/09/2018 (Approximate)   BMI 29.40 kg/m  No intake/output data recorded. No intake/output data recorded.  FHT:  FHR: 135 bpm, variability: moderate,  accelerations:  Present,  decelerations:  Absent UC:   Irregular q 2-4 min SVE:   Dilation: 2 Effacement (%): 50 Station: -3 Exam by:: Thalia Bloodgood, CNM  Labs: Lab Results  Component Value Date   WBC 8.8 03/09/2019   HGB 11.8 (L) 03/09/2019   HCT 35.9 (L) 03/09/2019   MCV 90.7 03/09/2019   PLT 159 03/09/2019    Assessment / Plan: Category I tracing Foley bulb still in place S/p Cytotec #1 at 1500. Additional Cytotec not currently indicated Discussed Cytotec #2 or Pitocin when foley bulb dislodges or if contractions space out Anticipate NSVD  Tiffany Bowman, CNM 03/09/2019, 7:29 PM

## 2019-03-09 NOTE — Progress Notes (Signed)
Patient ID: [name], female   DOB: 1991-07-16, 28 y.o.   MRN: [id number] Doing well  Vitals:   03/09/19 1949 03/09/19 2057 03/09/19 2210 03/09/19 2247  BP: 130/79 135/86 133/83 126/62  Pulse: (!) 101 (!) 103 88 70  Resp:  16 16   Temp:  98.6 F (37 C)    TempSrc:  Oral    Weight:      Height:       Fetal heart rate reassuring UCs every 2 min but mild  Dilation: 3.5 Effacement (%): 60 Station: -2 Presentation: Vertex Exam by:: Wynelle Bourgeois, CNM  Will start Pitocin since contractions are too close for Cytotec

## 2019-03-09 NOTE — Progress Notes (Signed)
   PRENATAL VISIT NOTE  Subjective:  Tiffany Bowman is a 28 y.o. G2P0010 at [redacted]w[redacted]d being seen today for ongoing prenatal care.  She is currently monitored for the following issues for this low-risk pregnancy and has Venereal wart; Lumbar herniated disc; Generalized anxiety disorder with panic attacks; Fear of flying; and Supervision of normal first pregnancy on their problem list.  Patient reports headache.  Contractions: Not present. Vag. Bleeding: None.  Movement: Present. Denies leaking of fluid.   The following portions of the patient's history were reviewed and updated as appropriate: allergies, current medications, past family history, past medical history, past social history, past surgical history and problem list.   Objective:   Vitals:   03/09/19 1105 03/09/19 1123  BP: (!) 148/91 128/85  Pulse: 96 (!) 101  Temp: (!) 97.4 F (36.3 C)   Weight: 174 lb 12.8 oz (79.3 kg)     Fetal Status: Fetal Heart Rate (bpm): NST   Movement: Present     General:  Alert, oriented and cooperative. Patient is in no acute distress.  Skin: Skin is warm and dry. No rash noted.   Cardiovascular: Normal heart rate noted  Respiratory: Normal respiratory effort, no problems with respiration noted  Abdomen: Soft, gravid, appropriate for gestational age.  Pain/Pressure: Absent     Pelvic: Cervical exam deferred        Extremities: Normal range of motion.  Edema: Trace  Mental Status: Normal mood and affect. Normal behavior. Normal judgment and thought content.   Assessment and Plan:  Pregnancy: G2P0010 at [redacted]w[redacted]d 1. Encounter for supervision of normal first pregnancy in third trimester - Patient reports HA that started this morning around 0300, describes it being a "dull headache that is behind my eyes". Has tried sleeping, hydration and caffeine for HA without relief. Denies taking any medication for HA. Denies hx of HA during pregnancy or prior to pregnancy.  - BP elevated in office today 148/91 -  BPP 8/8 and AFI 8.5 at MFM prior to this appointment today, denies MFM taking BP in office  - NST reactive: baseline 140/moderate/ +accels/ no decel  - Based on new onset of HTN, post dates and HA, will direct admit to L&D today, report given to [facility] CNM.  - Fetal nonstress test  Term labor symptoms and general obstetric precautions including but not limited to vaginal bleeding, contractions, leaking of fluid and fetal movement were reviewed in detail with the patient. Please refer to After Visit Summary for other counseling recommendations.   Return in about 4 weeks (around 04/06/2019) for POSTPARTUM/IUD insertion.  Future Appointments  Date Time Provider Department Center  03/13/2019  7:30 AM [facility]U 1 [facility] None  03/15/2019  7:00 AM [facility]-LD SCHED ROOM [facility] None    Sharyon Cable, CNM

## 2019-03-09 NOTE — Progress Notes (Signed)
Pt is here for ROB/NST. [redacted]w[redacted]d. Pt has c/o dull headache for 1 day, no blurry vision.

## 2019-03-10 ENCOUNTER — Inpatient Hospital Stay (HOSPITAL_COMMUNITY): Payer: Managed Care, Other (non HMO) | Admitting: Anesthesiology

## 2019-03-10 ENCOUNTER — Encounter (HOSPITAL_COMMUNITY): Payer: Self-pay

## 2019-03-10 DIAGNOSIS — O134 Gestational [pregnancy-induced] hypertension without significant proteinuria, complicating childbirth: Secondary | ICD-10-CM

## 2019-03-10 DIAGNOSIS — O480 Post-term pregnancy: Secondary | ICD-10-CM

## 2019-03-10 DIAGNOSIS — Z3A40 40 weeks gestation of pregnancy: Secondary | ICD-10-CM

## 2019-03-10 DIAGNOSIS — O139 Gestational [pregnancy-induced] hypertension without significant proteinuria, unspecified trimester: Secondary | ICD-10-CM | POA: Diagnosis present

## 2019-03-10 LAB — CBC
HCT: 37.5 % (ref 36.0–46.0)
Hemoglobin: 12.3 g/dL (ref 12.0–15.0)
MCH: 29.6 pg (ref 26.0–34.0)
MCHC: 32.8 g/dL (ref 30.0–36.0)
MCV: 90.1 fL (ref 80.0–100.0)
Platelets: 141 10*3/uL — ABNORMAL LOW (ref 150–400)
RBC: 4.16 MIL/uL (ref 3.87–5.11)
RDW: 13.2 % (ref 11.5–15.5)
WBC: 10.7 10*3/uL — ABNORMAL HIGH (ref 4.0–10.5)
nRBC: 0 % (ref 0.0–0.2)

## 2019-03-10 LAB — RPR: RPR Ser Ql: NONREACTIVE

## 2019-03-10 MED ORDER — OXYCODONE HCL 5 MG PO TABS: 5.0000 mg | ORAL_TABLET | ORAL | Status: DC | PRN

## 2019-03-10 MED ORDER — PHENYLEPHRINE 40 MCG/ML (10ML) SYRINGE FOR IV PUSH (FOR BLOOD PRESSURE SUPPORT)
80.0000 ug | PREFILLED_SYRINGE | INTRAVENOUS | Status: DC | PRN
  Filled 2019-03-10: qty 10

## 2019-03-10 MED ORDER — COCONUT OIL OIL
1.0000 "application " | TOPICAL_OIL | Status: DC | PRN
  Administered 2019-03-11: 1 via TOPICAL

## 2019-03-10 MED ORDER — WITCH HAZEL-GLYCERIN EX PADS: 1.0000 "application " | MEDICATED_PAD | CUTANEOUS | Status: DC | PRN

## 2019-03-10 MED ORDER — PHENYLEPHRINE 40 MCG/ML (10ML) SYRINGE FOR IV PUSH (FOR BLOOD PRESSURE SUPPORT): 80.0000 ug | PREFILLED_SYRINGE | INTRAVENOUS | Status: DC | PRN

## 2019-03-10 MED ORDER — ONDANSETRON HCL 4 MG/2ML IJ SOLN: 4.0000 mg | INTRAMUSCULAR | Status: DC | PRN

## 2019-03-10 MED ORDER — DIPHENHYDRAMINE HCL 50 MG/ML IJ SOLN: 12.5000 mg | INTRAMUSCULAR | Status: DC | PRN

## 2019-03-10 MED ORDER — BENZOCAINE-MENTHOL 20-0.5 % EX AERO
1.0000 "application " | INHALATION_SPRAY | CUTANEOUS | Status: DC | PRN
  Administered 2019-03-10: 1 via TOPICAL
  Filled 2019-03-10: qty 56

## 2019-03-10 MED ORDER — FENTANYL-BUPIVACAINE-NACL 0.5-0.125-0.9 MG/250ML-% EP SOLN
12.0000 mL/h | EPIDURAL | Status: DC | PRN
  Filled 2019-03-10: qty 250

## 2019-03-10 MED ORDER — SIMETHICONE 80 MG PO CHEW: 80.0000 mg | CHEWABLE_TABLET | ORAL | Status: DC | PRN

## 2019-03-10 MED ORDER — LACTATED RINGERS IV SOLN: 500.0000 mL | Freq: Once | INTRAVENOUS | Status: DC

## 2019-03-10 MED ORDER — SODIUM CHLORIDE (PF) 0.9 % IJ SOLN
INTRAMUSCULAR | Status: DC | PRN
  Administered 2019-03-10: 12 mL/h via EPIDURAL

## 2019-03-10 MED ORDER — DIBUCAINE (PERIANAL) 1 % EX OINT
1.0000 "application " | TOPICAL_OINTMENT | CUTANEOUS | Status: DC | PRN
  Filled 2019-03-10: qty 28

## 2019-03-10 MED ORDER — SENNOSIDES-DOCUSATE SODIUM 8.6-50 MG PO TABS
2.0000 | ORAL_TABLET | ORAL | Status: DC
  Administered 2019-03-10: 23:00:00 2 via ORAL
  Filled 2019-03-10: qty 2

## 2019-03-10 MED ORDER — PRENATAL MULTIVITAMIN CH
1.0000 | ORAL_TABLET | Freq: Every day | ORAL | Status: DC
  Filled 2019-03-10: qty 1

## 2019-03-10 MED ORDER — ONDANSETRON HCL 4 MG PO TABS: 4.0000 mg | ORAL_TABLET | ORAL | Status: DC | PRN

## 2019-03-10 MED ORDER — HYDROXYZINE HCL 50 MG PO TABS
25.0000 mg | ORAL_TABLET | Freq: Three times a day (TID) | ORAL | Status: DC | PRN
  Administered 2019-03-10: 13:00:00 25 mg via ORAL
  Filled 2019-03-10: qty 1

## 2019-03-10 MED ORDER — ZOLPIDEM TARTRATE 5 MG PO TABS: 5.0000 mg | ORAL_TABLET | Freq: Every evening | ORAL | Status: DC | PRN

## 2019-03-10 MED ORDER — LACTATED RINGERS IV SOLN
500.0000 mL | Freq: Once | INTRAVENOUS | Status: AC
  Administered 2019-03-10: 12:00:00 500 mL via INTRAVENOUS

## 2019-03-10 MED ORDER — IBUPROFEN 600 MG PO TABS
600.0000 mg | ORAL_TABLET | Freq: Four times a day (QID) | ORAL | Status: DC
  Administered 2019-03-10 – 2019-03-11 (×3): 600 mg via ORAL
  Filled 2019-03-10 (×5): qty 1

## 2019-03-10 MED ORDER — EPHEDRINE 5 MG/ML INJ: 10.0000 mg | INTRAVENOUS | Status: DC | PRN

## 2019-03-10 MED ORDER — DIPHENHYDRAMINE HCL 25 MG PO CAPS: 25.0000 mg | ORAL_CAPSULE | Freq: Four times a day (QID) | ORAL | Status: DC | PRN

## 2019-03-10 MED ORDER — TETANUS-DIPHTH-ACELL PERTUSSIS 5-2.5-18.5 LF-MCG/0.5 IM SUSP: 0.5000 mL | Freq: Once | INTRAMUSCULAR | Status: DC

## 2019-03-10 MED ORDER — LIDOCAINE HCL (PF) 1 % IJ SOLN
INTRAMUSCULAR | Status: DC | PRN
  Administered 2019-03-10 (×2): 5 mL via EPIDURAL

## 2019-03-10 MED ORDER — ACETAMINOPHEN 325 MG PO TABS
650.0000 mg | ORAL_TABLET | ORAL | Status: DC | PRN
  Administered 2019-03-10: 20:00:00 650 mg via ORAL
  Filled 2019-03-10: qty 2

## 2019-03-10 NOTE — Progress Notes (Signed)
Patient ID: Tiffany Bowman, female   DOB: 01/02/1991, 28 y.o.   MRN: [id number]  A little discouraged with the lengthiness of induction, but overall doing well; has been up out of bed earlier- on ball/chair; had H/A earlier but it was relieved with Tylenol  BPs 100/56, 119/75, 132/80 FHR 130s, +accels, no decels Ctx irreg 2-4, Pit @ 69mu/min Cx was 3-4/60/vtx -2 a little before 0700  IUP@40 .2wks gHTN- stable IOL process  Will continue increasing Pitocin to achieve active labor Plan to check cx in a couple of hours Encouraged pt  [name] [facility] 03/10/2019 9:34 AM

## 2019-03-10 NOTE — Anesthesia Preprocedure Evaluation (Signed)
Anesthesia Evaluation  Patient identified by MRN, date of birth, ID band Patient awake    Reviewed: Allergy & Precautions, H&P , NPO status , Patient's Chart, lab work & pertinent test results  Airway Mallampati: II   Neck ROM: full    Dental   Pulmonary neg pulmonary ROS,    breath sounds clear to auscultation       Cardiovascular negative cardio ROS   Rhythm:regular Rate:Normal     Neuro/Psych PSYCHIATRIC DISORDERS Anxiety    GI/Hepatic   Endo/Other    Renal/GU      Musculoskeletal   Abdominal   Peds  Hematology   Anesthesia Other Findings   Reproductive/Obstetrics (+) Pregnancy                             Anesthesia Physical Anesthesia Plan  ASA: II  Anesthesia Plan: Epidural   Post-op Pain Management:    Induction: Intravenous  PONV Risk Score and Plan: 2 and Treatment may vary due to age or medical condition  Airway Management Planned: Natural Airway  Additional Equipment:   Intra-op Plan:   Post-operative Plan:   Informed Consent: I have reviewed the patients History and Physical, chart, labs and discussed the procedure including the risks, benefits and alternatives for the proposed anesthesia with the patient or authorized representative who has indicated his/her understanding and acceptance.       Plan Discussed with: Anesthesiologist  Anesthesia Plan Comments:         Anesthesia Quick Evaluation

## 2019-03-10 NOTE — Anesthesia Procedure Notes (Signed)
Epidural Patient location during procedure: OB Start time: 03/10/2019 12:31 PM End time: 03/10/2019 12:41 PM  Staffing Anesthesiologist: Achille Rich, MD Performed: anesthesiologist   Preanesthetic Checklist Completed: patient identified, site marked, pre-op evaluation, timeout performed, IV checked, risks and benefits discussed and monitors and equipment checked  Epidural Patient position: sitting Prep: DuraPrep Patient monitoring: heart rate, cardiac monitor, continuous pulse ox and blood pressure Approach: midline Location: L2-L3 Injection technique: LOR saline  Needle:  Needle type: Tuohy  Needle gauge: 17 G Needle length: 9 cm Needle insertion depth: 5 cm Catheter type: closed end flexible Catheter size: 19 Gauge Catheter at skin depth: 12 cm Test dose: negative and Other  Assessment Events: blood not aspirated, injection not painful, no injection resistance and negative IV test  Additional Notes Informed consent obtained prior to proceeding including risk of failure, 1% risk of PDPH, risk of minor discomfort and bruising.  Discussed rare but serious complications including epidural abscess, permanent nerve injury, epidural hematoma.  Discussed alternatives to epidural analgesia and patient desires to proceed.  Timeout performed pre-procedure verifying patient name, procedure, and platelet count.  Patient tolerated procedure well. Reason for block:procedure for pain

## 2019-03-10 NOTE — Progress Notes (Signed)
Patient ID: Tiffany Bowman, female   DOB: [birth year], 28 y.o.   MRN: [id number]  Comfortable now with epidural, but feeling a bit anxious; leaking at 1130  BPs 126/73, 132/78, P 81 FHR 130s, +accels, no decels, Cat 1 Ctx q 1-2 mins with Pit @ 85mu/min Cx 4-5/90/vtx -1  IUP@term  gHTN- stable Latent labor SROM  Plan next cx check in 2-3 hrs Vistaril tid prn  [name] CNM 03/10/2019 1:28 PM

## 2019-03-10 NOTE — Discharge Summary (Signed)
Postpartum Discharge Summary     Patient Name: Tiffany Bowman DOB: Jul 07, 1991 MRN: [id number]  Date of admission: 03/09/2019 Delivering Provider: Cam Hai D   Date of discharge: 03/11/2019  Admitting diagnosis: INDUCTION Intrauterine pregnancy: [redacted]w[redacted]d     Secondary diagnosis:  Active Problems:   Generalized anxiety disorder with panic attacks   Post-dates pregnancy   Gestational hypertension  Additional problems: none     Discharge diagnosis: Term Pregnancy Delivered and Gestational Hypertension                                                                                                Post partum procedures:none  Augmentation: Pitocin, Cytotec and Foley Balloon  Complications: None  Hospital course:  Induction of Labor With Vaginal Delivery   28 y.o. yo G2P0010 at [redacted]w[redacted]d was admitted to the hospital 03/09/2019 for induction of labor.  Indication for induction: Gestational hypertension.  Patient had an uncomplicated labor course as follows: Membrane Rupture Time/Date: 11:30 AM ,03/10/2019   Intrapartum Procedures: Episiotomy: None [1]                                         Lacerations:  2nd degree [3];Perineal [11]  Patient had delivery of a Viable infant.  Information for the patient's newborn:  Cathia, Eckenrode [[id number]]  Delivery Method: Vag-Spont   03/10/2019  Details of delivery can be found in separate delivery note. Baby spent some time in NICU d/t hypoglycemia/breathing issues, now in regular nursery. Patient had a routine postpartum course. Patient requests to be discharged to rooming in 03/11/19.  Magnesium Sulfate recieved: No BMZ received: No  Physical exam  Vitals:   03/10/19 1800 03/10/19 2245 03/11/19 0200 03/11/19 0815  BP: 128/89 119/74 124/73 118/83  Pulse: (!) 101 93 78 80  Resp: 18 18 16 16   Temp: 98.8 F (37.1 C) 97.8 F (36.6 C) 98 F (36.7 C) 98.3 F (36.8 C)  TempSrc: Oral Oral Oral Oral  SpO2:  100% 100% 100%  Weight:       Height:       General: alert, cooperative and no distress Lochia: appropriate Uterine Fundus: firm Incision: N/A DVT Evaluation: No evidence of DVT seen on physical exam. Negative Homan's sign. No cords or calf tenderness. Labs: Lab Results  Component Value Date   WBC 10.7 (H) 03/10/2019   HGB 12.3 03/10/2019   HCT 37.5 03/10/2019   MCV 90.1 03/10/2019   PLT 141 (L) 03/10/2019   CMP Latest Ref Rng & Units 03/09/2019  Glucose 70 - 99 mg/dL 88  BUN 6 - 20 mg/dL 5(L)  Creatinine 2.95 - 1.00 mg/dL 2.84  Sodium 132 - 440 mmol/L 134(L)  Potassium 3.5 - 5.1 mmol/L 3.8  Chloride 98 - 111 mmol/L 103  CO2 22 - 32 mmol/L 22  Calcium 8.9 - 10.3 mg/dL 1.0(U)  Total Protein 6.5 - 8.1 g/dL 6.1(L)  Total Bilirubin 0.3 - 1.2 mg/dL 0.5  Alkaline Phos 38 - 126 U/L 129(H)  AST  15 - 41 U/L 26  ALT 0 - 44 U/L 21    Discharge instruction: per After Visit Summary and "Baby and Me Booklet".  After visit meds:  Allergies as of 03/11/2019      Reactions   Azithromycin Itching   Prozac [fluoxetine Hcl] Itching   tching   Penicillins Rash      Medication List    STOP taking these medications   Comfort Fit Maternity Supp Sm Misc   ferrous sulfate 325 (65 FE) MG tablet   PARoxetine 10 MG tablet Commonly known as:  PAXIL     TAKE these medications   ibuprofen 600 MG tablet Commonly known as:  ADVIL Take 1 tablet (600 mg total) by mouth every 6 (six) hours.   Vitafol Ultra 29-0.6-0.4-200 MG Caps Take 1 capsule by mouth daily before breakfast.       Diet: No evidence of DVT seen on physical exam. Negative Homan's sign. No cords or calf tenderness.  Activity: Advance as tolerated. Pelvic rest for 6 weeks.   Outpatient follow up:BP check 1 wk; PP visit 4 wks Follow up Appt:No future appointments. Follow up Visit: Follow-up Information    [facility]. Schedule an appointment as soon as possible for a visit in 6 week(s).   Why:  for postpartum check up and Paragard IUD  insertion Contact information: [address] [place] [phone]          Please schedule this patient for Postpartum visit in: 1wk BP check, then 4 wks PP visit with the following provider: Any provider For C/S patients schedule nurse incision check in weeks 2 weeks: no High risk pregnancy complicated by: HTN Delivery mode:  SVD Anticipated Birth Control:  Paragard PP Procedures needed: BP check- 1 week Schedule Integrated BH visit: no   Newborn Data: Live born female  Birth Weight:  4005 gm (8lb 13.3oz) APGAR: 5, 8  Newborn Delivery   Birth date/time:  03/10/2019 14:53:00 Delivery type:  Vaginal, Spontaneous     Baby Feeding: Breast Disposition:rooming in   03/11/2019 Jacklyn ShellFrances Cresenzo-Dishmon, CNM

## 2019-03-10 NOTE — Progress Notes (Signed)
Patient ID: Tiffany Bowman, female   DOB: 07/31/1991, 28 y.o.   MRN: [id number] Doing well   Contractions somewhat stronger but not unbearable yet  Vitals:   03/10/19 0032 03/10/19 0102 03/10/19 0130 03/10/19 0200  BP: 122/77 (!) 109/47 121/65 118/74  Pulse: 67 67 74 80  Resp:   16 16  Temp:      TempSrc:      Weight:      Height:        Fetal heart rate pattern reassuring UCs every 2 min  Cervix exam deferred

## 2019-03-11 MED ORDER — IBUPROFEN 600 MG PO TABS: 600.0000 mg | ORAL_TABLET | Freq: Four times a day (QID) | ORAL | 0 refills | Status: DC

## 2019-03-11 NOTE — Lactation Note (Signed)
This note was copied from a baby's chart. Lactation Consultation Note LC paged to assist mother with latching infant on while in NICU. Mother is a p 1 . She reports that infant has been difficult to latch. Mother has had multiple attempts  To latch infant but only on sustained latch for 12 mins. Infant is now 36 hours old. Mother is a P1.  Several attempts to latch infant.   Infant finally latched and sustained latch for 15 min and then offered the alternate breast and sustained latch for another 15 mins.   Mother taught breast compression. Also reviewed hand expression. Mother reports that she has not been able  to obtain any colostrum on her own.  Assist mother with hand expression and obtained large drops of colostrum when hand expressed.   Lots of teaching with mother .mother is aware of available LC services. Patient Name: Tiffany Bowman [id number] Date: 03/11/2019     Maternal Data    Feeding Feeding Type: Breast Fed  LATCH Score Latch: Grasps breast easily, tongue down, lips flanged, rhythmical sucking.  Audible Swallowing: A few with stimulation  Type of Nipple: Everted at rest and after stimulation  Comfort (Breast/Nipple): Soft / non-tender  Hold (Positioning): Assistance needed to correctly position infant at breast and maintain latch.  LATCH Score: 8  Interventions    Lactation Tools Discussed/Used     Consult Status      Michel Bickers 03/11/2019, 3:30 PM

## 2019-03-11 NOTE — Anesthesia Postprocedure Evaluation (Signed)
Anesthesia Post Note  Patient: Tiffany Bowman  Procedure(s) Performed: AN AD HOC LABOR EPIDURAL     Patient location during evaluation: Mother Baby Anesthesia Type: Epidural Level of consciousness: awake and alert and oriented Pain management: satisfactory to patient Vital Signs Assessment: post-procedure vital signs reviewed and stable Respiratory status: spontaneous breathing and nonlabored ventilation Cardiovascular status: stable Postop Assessment: no headache, no backache, no signs of nausea or vomiting, adequate PO intake, patient able to bend at knees and able to ambulate (patient up walking) Anesthetic complications: no    Last Vitals:  Vitals:   03/11/19 0200 03/11/19 0815  BP: 124/73 118/83  Pulse: 78 80  Resp: 16 16  Temp: 36.7 C 36.8 C  SpO2: 100% 100%    Last Pain:  Vitals:   03/11/19 0815  TempSrc: Oral  PainSc: 2    Pain Goal: Patients Stated Pain Goal: 5 (03/10/19 1121)                 Madison Hickman

## 2019-03-11 NOTE — Discharge Instructions (Signed)

## 2019-03-11 NOTE — Clinical Social Work Maternal (Signed)
CLINICAL SOCIAL WORK MATERNAL/CHILD NOTE  Patient Details  Name: Tiffany Bowman MRN: [id number] Date of Birth: [birth year]  Date:  03/11/2019  Clinical Social Worker Initiating Note:  Hortencia Pilar, Theresia Majors  Date/Time: Initiated:  03/11/19/0945     Child's Name:  Tiffany Bowman   Biological Parents:  Mother, Father([name] (MOB), [name]( FOB) )   Need for Interpreter:  None   Reason for Referral:  Other (Comment)(MOB with high anxiety and infant in NICU.)   Address:  [address] [place] [address]    Phone number:  [phone] (home)     Additional phone number: none   Household Members/Support Persons (HM/SP):   Household Member/Support Person 3   HM/SP Name Relationship DOB or Age  HM/SP -1   [name] (FOB)   FOB  unknown   HM/SP -2   [name] (MOB)   MOB   26  HM/SP -3      HM/SP -4        HM/SP -5        HM/SP -6        HM/SP -7        HM/SP -8          Natural Supports (not living in the home):  Immediate Family, Extended Family, Parent   Professional Supports: None   Employment: Engineer, manufacturing )   Type of Work: Architect    Education:  Engineer, maintenance (IT)   Homebound arranged:    Surveyor, quantity Resources:  [employer], Media planner   Other Resources:    none   Cultural/Religious Considerations Which May Impact Care:  none reported   Strengths:  Ability to meet basic needs , Compliance with medical plan , Home prepared for child    Psychotropic Medications:     Paxil for MOB's anxiety.   Pediatrician:   [facility].     Pediatrician List:   [facility]      Pediatrician Fax Number:    Risk Factors/Current Problems:  None   Cognitive State:  Alert , Goal Oriented , Insightful    Mood/Affect:  Bright , Happy , Calm , Comfortable , Tearful (MOB tearful as infant in NICU doing well, with  hopes of going home. )   CSW Assessment: CSW consulted as MOB has a history of anxiety and panic attacks. CSW spoke with MOB at bedside to address further needs. CSW entered the room and observed that MOB was sitting up on the couch. CSW introduced role and explained to MOB the reason for the visit.    MOB expressed that she was diagnosed with anxiety while in college 2014. MOB reported that she thinks it was brought on by her parents getting a divorce. MOB expressed that once she became pregnant, she stopped taking all psych meds as she didn't want it to get into the ifnants system. CSW was advised that MOB was fine during pregnancy and had no signs of anxiety or panic attacks. MOB reported that she hasn't had a panic attack in years and suggested that it is well managed at this time. MOB reported that she has spoken with her OB and he has prescribed her Paxil for her anxiety as MOB later reported that anxiety may have flared up towards the end of her pregnancy. 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 CSW offered MOB therapy resources however MOB expressed that if meds aren't enough for her then she would look into other options. CSW agreeable at this time.   CSW made aware by MOB that infant was placed in the NICU last night. CSW sought MOB's feelings around this. MOB reported that she was very sad about this as infant was taken away from her so fast to go to the NICU. MOB expressed that she understood why but she was sad about this. MOB credited FOB Greggory Stallion(George) with being a great sport to go be with infant as MOB was unable to at that time. MOB informed CSW that she has support from FOB and other family and friends.  MOB doesn't get WIC or Food Stamps. 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 MOB reported that she was in school to be a Engineer, civil (consulting)urse however she found out that she was "having this little nugget" (referring to being pregnant with infant) and chose to stop at that time. CSW encouraged MOB to re-enroll once she felt ready to do so.   MOB and FOB [facility]. FOB entered the room during the assessment and MOB introduced CSW to FOB. FOB expressed that he has been in the NICU with infant and that infant is doing amazing and will likely be discharged today around 3pm. CSW advised MOB that CSW would continue to follow for further needs at this time.   CSW provided MOB with Postpartum Progress Checklist as well as provided education to MOB On SIDS and PPD. MOB appeared to have a clear understanding of these topics and expressed no further concerns at this time.   CSW Plan/Description:  No Further Intervention Required/No Barriers to Discharge, Sudden Infant Death Syndrome (SIDS) Education, Perinatal Mood and Anxiety Disorder (PMADs) Education, Other Patient/Family Education    Robb MatarKierra S Rael Tilly, LCSWA 03/11/2019, 10:21 AM

## 2019-03-11 NOTE — Lactation Note (Signed)
This note was copied from a baby's chart. Lactation Consultation Note  Patient Name: Tiffany Bowman [id number] Date: 03/11/2019 Reason for consult: Initial assessment;Term;NICU baby;Difficult latch P1, 12 hour female infant, NICU due respiratory distress.  Mom with hx HTN in pregnancy. Infant had 3 stools since delivery.  Mom has DEBP at home. Infant wasn't cuing at first, [facility] discussed mom doing STS. LC discussed hand expression and mom taught back , infant was given 8 ml of colostrum by spoon. Infant became more alert and started cuing to breastfeed. LC notice mom is semi- short shafted but breast responds well to stimulation, LC asked mom to do breast stimulation and hand expression a small amount of colostrum out on breast prior to latching infant. Mom latched infant on left breast using the cross cradle hold, infant latched with wide gape, chin touching breast and sustained latch, still breastfeeding ( 12 minutes) when LC left the room. LC discussed breastfeeding infant according hunger cues, 8 or more times within 24 hours. Mom know to use DEBP every 3 hours on initial setting for 15 minutes. Mom shown how to use DEBP & how to disassemble, clean, & reassemble parts by Nurse. LC discussed I & O. Reviewed Baby & Me book's Breastfeeding Basics.  Mom knows to call Nurse or LC if she has any questions, concerns or need assistance latching infant to breast. LC discussed  Virtual Breastfeeding Support  Group.  Mom made aware of O/P services, breastfeeding support groups, community resources, and our phone # for post-discharge questions.  Maternal Data Formula Feeding for Exclusion: No Has patient been taught Hand Expression?: Yes(Infant given 8 ml of colostrum by spoon.)  Feeding Feeding Type: Breast Fed  LATCH Score Latch: Grasps breast easily, tongue down, lips flanged, rhythmical sucking.  Audible Swallowing: A few with stimulation  Type of Nipple: Everted at rest and  after stimulation  Comfort (Breast/Nipple): Soft / non-tender  Hold (Positioning): Assistance needed to correctly position infant at breast and maintain latch.  LATCH Score: 8  Interventions Interventions: Breast feeding basics reviewed;Assisted with latch;Skin to skin;Breast massage;Hand express;Support pillows;Adjust position;DEBP;Expressed milk;Breast compression;Position options  Lactation Tools Discussed/Used WIC Program: No Pump Review: Setup, frequency, and cleaning;Milk Storage Initiated by:: by Nurse Date initiated:: 03/11/19   Consult Status Consult Status: Follow-up Date: 03/11/19 Follow-up type: In-patient    Danelle Earthly 03/11/2019, 3:08 AM

## 2019-03-12 ENCOUNTER — Ambulatory Visit: Payer: Self-pay

## 2019-03-12 NOTE — Lactation Note (Signed)
This note was copied from a baby's chart. Lactation Consultation Note  Patient Name: Tiffany Bowman [id number] Date: 03/12/2019 Reason for consult: Follow-up assessment;Primapara;1st time breastfeeding;Infant weight loss;Term 2nd visit to complete earlier consult - Serum Bili - 12.0  Baby for D/C.  Per mom the last latch was much better than it has been and was similar to when you were here earlier.  LC instructed mom on the use shells, comfort gels, and sized mom for a nipple shield #20 NS and #24 NS.  LC unable to assess baby latched with a NS , the #20 NS fist well, but may be to snug when the baby starts feeding.  Also the #24 NS fits well.  LC advised mom if she has to use it for plan B  To latch apply as shown and instill EBM in the top and when the  The baby is latched the side of the NS should not show, lips should be flanged like FISH lips.  Sore nipple and engorgement prevention and tx reviewed .  Per mom has DEBP at home [facility]).  Mom aware of the [place] breast feeding resources and plans to see a community LC for F/U.  Mom expressed appreciation for [facility] assistance and expressed she felt much better with latching  Even though she was very tired.     Maternal Data Has patient been taught Hand Expression?: Yes  Feeding Feeding Type: Breast Fed  LATCH Score Latch: Grasps breast easily, tongue down, lips flanged, rhythmical sucking.  Audible Swallowing: Spontaneous and intermittent  Type of Nipple: Everted at rest and after stimulation  Comfort (Breast/Nipple): Soft / non-tender  Hold (Positioning): Assistance needed to correctly position infant at breast and maintain latch.  LATCH Score: 9  Interventions Interventions: Breast feeding basics reviewed  Lactation Tools Discussed/Used Tools: Shells;Comfort gels;Nipple Shields Nipple shield size: 24;20;Other (comment)(see LC note for sizing of the NS ) Shell Type: Inverted Breast pump type: Double-Electric  Breast Pump;Manual   Consult Status Consult Status: Follow-up Date: (per mom will be seeing a community LC for F/U ) Follow-up type: Out-patient    Tiffany Bowman 03/12/2019, 1:27 PM

## 2019-03-12 NOTE — Lactation Note (Signed)
This note was copied from a baby's chart. Lactation Consultation Note  Patient Name: Tiffany Bowman [id number] Date: 03/12/2019 Reason for consult: Follow-up assessment;Primapara;1st time breastfeeding;Infant weight loss(5 % weight loss )  Baby is 43 hours old / Serum Bili 10.1 at 38 hours old / 5% weight loss  As LC entered the room, mom resting in bed holding baby has she was rooting.  LC offered to assist and LC changed a medium wet diaper and assisted to latch on the  Right breast / football/ and abby fed for 10 mins with swallows. LC assisted and worked on  Depth with mom. Mom does have short shaft nipple / compressible areolas indicating the use of shells  Between feedings except when sleeping to elongate the nipple / areola complex.  Baby asleep for short time and woke back up rooting. Baby latched on the left breast / cross cradle with  Only pillow support assist/ depth achieved and baby fed for 5 mins and fell asleep.  Mom mentioned she was unable to sleep very much last night even though the baby slept after 2 20 mins feedings  And supplemented with formula 12 ml due to mom not feeling the baby was getting enough.   LC - recommended to mom due to her short shaft nipples  Shells between feedings except when sleeping alternating with comfort gels  Prior to latch - breast massage , hand express, pre-pump to prime the milk ducts and make the nipple / areola  Complex more elastic for a deeper latch.  STS feedings until the baby is back to birth weight, gaining steadily and can stay awake for majority of feeding.  Engorgement prevention and tx reviewed.    Per mom has DEBP at home , hand pump from the hospital, comfort gels and breast shells ( as directed) ,   Per  Mom will be F/U with community [facility] for breast feeding.  Mom aware of the [place] breast feeding resources after D/C.   Dr. Chestine Spore into see mom and LC will F/U to size mom for NS prior to D/C .      Maternal  Data Has patient been taught Hand Expression?: Yes  Feeding Feeding Type: Breast Fed  LATCH Score Latch: Repeated attempts needed to sustain latch, nipple held in mouth throughout feeding, stimulation needed to elicit sucking reflex.  Audible Swallowing: Spontaneous and intermittent  Type of Nipple: Everted at rest and after stimulation  Comfort (Breast/Nipple): Soft / non-tender  Hold (Positioning): Assistance needed to correctly position infant at breast and maintain latch.  LATCH Score: 8  Interventions Interventions: Breast feeding basics reviewed;Assisted with latch;Skin to skin;Breast massage;Hand express;Breast compression;Adjust position;Support pillows;Position options;Expressed milk;Shells;Hand pump;DEBP  Lactation Tools Discussed/Used     Consult Status Consult Status: Follow-up Date: 03/12/19 Follow-up type: In-patient    Matilde Sprang Lavoris Sparling 03/12/2019, 10:35 AM

## 2019-03-13 ENCOUNTER — Inpatient Hospital Stay (HOSPITAL_COMMUNITY)
Admit: 2019-03-13 | Discharge: 2019-03-13 | Disposition: A | Discharge status: Home / Self Care | Payer: Managed Care, Other (non HMO)

## 2019-03-13 ENCOUNTER — Other Ambulatory Visit (HOSPITAL_COMMUNITY)
Admission: RE | Admit: 2019-03-13 | Discharge: 2019-03-13 | Disposition: A | Discharge status: Home / Self Care | Payer: Managed Care, Other (non HMO) | Source: Ambulatory Visit

## 2019-03-15 ENCOUNTER — Inpatient Hospital Stay (HOSPITAL_COMMUNITY): Payer: Managed Care, Other (non HMO)

## 2019-03-17 ENCOUNTER — Other Ambulatory Visit: Payer: Self-pay

## 2019-03-17 ENCOUNTER — Ambulatory Visit: Payer: Managed Care, Other (non HMO)

## 2019-03-17 VITALS — BP 118/77 | HR 97 | Wt 151.8 lb

## 2019-03-17 DIAGNOSIS — Z0130 Encounter for examination of blood pressure without abnormal findings: Secondary | ICD-10-CM

## 2019-03-17 NOTE — Progress Notes (Signed)
..  Subjective:  Tiffany Bowman is a 28 y.o. female here for BP check.   Hypertension ROS: Pt is currently not taking any BP meds, denies any abnormal symptoms, no swelling noted.  Objective:  BP 118/77   Pulse 97   Wt 151 lb 12.8 oz (68.9 kg)   LMP 06/09/2018 (Approximate)   BMI 25.26 kg/m   Appearance alert, well appearing, and in no distress. General exam BP noted to be well controlled today in office.    Assessment:   Blood Pressure well controlled.   Plan:  Current treatment plan is effective, no change in therapy.. Pt has pp visit scheduled for 04-07-19.

## 2019-03-17 NOTE — Progress Notes (Signed)
I have reviewed the chart and agree with nursing staff's documentation of this patient's encounter.  Tiffany Falkenstein, MD 03/17/2019 11:53 AM    

## 2019-04-07 ENCOUNTER — Ambulatory Visit (INDEPENDENT_AMBULATORY_CARE_PROVIDER_SITE_OTHER): Payer: Managed Care, Other (non HMO) | Admitting: Medical

## 2019-04-07 ENCOUNTER — Encounter: Payer: Self-pay | Admitting: Medical

## 2019-04-07 ENCOUNTER — Other Ambulatory Visit: Payer: Self-pay

## 2019-04-07 VITALS — BP 118/73 | HR 79 | Wt 151.8 lb

## 2019-04-07 DIAGNOSIS — Z30430 Encounter for insertion of intrauterine contraceptive device: Secondary | ICD-10-CM | POA: Diagnosis not present

## 2019-04-07 DIAGNOSIS — Z8759 Personal history of other complications of pregnancy, childbirth and the puerperium: Secondary | ICD-10-CM

## 2019-04-07 HISTORY — DX: Personal history of other complications of pregnancy, childbirth and the puerperium: Z87.59

## 2019-04-07 LAB — POCT URINE PREGNANCY: Preg Test, Ur: NEGATIVE

## 2019-04-07 MED ORDER — PARAGARD INTRAUTERINE COPPER IU IUD
INTRAUTERINE_SYSTEM | Freq: Once | INTRAUTERINE | Status: AC
  Administered 2019-04-07: 09:00:00 via INTRAUTERINE

## 2019-04-07 NOTE — Patient Instructions (Signed)
IUD PLACEMENT POST-PROCEDURE INSTRUCTIONS  1. You may take Ibuprofen, Aleve or Tylenol for pain if needed.  Cramping should resolve within in 24 hours.  2. You may have a small amount of spotting.  You should wear a mini pad for the next few days.  3. You may have intercourse after 24 hours.  If you using this for birth control, it is effective immediately.  4. You need to call if you have any pelvic pain, fever, heavy bleeding or foul smelling vaginal discharge.  Irregular bleeding is common the first several months after having an IUD placed. You do not need to call for this reason unless you are concerned.  5. Shower or bathe as normal  You should have a follow-up appointment in 4-8 weeks for a re-check to make sure you are not having any problems.Intrauterine Device Insertion, Care After  This sheet gives you information about how to care for yourself after your procedure. Your health care provider may also give you more specific instructions. If you have problems or questions, contact your health care provider. What can I expect after the procedure? After the procedure, it is common to have:  Cramps and pain in the abdomen.  Light bleeding (spotting) or heavier bleeding that is like your menstrual period. This may last for up to a few days.  Lower back pain.  Dizziness.  Headaches.  Nausea. Follow these instructions at home:  Before resuming sexual activity, check to make sure that you can feel the IUD string(s). You should be able to feel the end of the string(s) below the opening of your cervix. If your IUD string is in place, you may resume sexual activity. ? If you had a hormonal IUD inserted more than 7 days after your most recent period started, you will need to use a backup method of birth control for 7 days after IUD insertion. Ask your health care provider whether this applies to you.  Continue to check that the IUD is still in place by feeling for the string(s) after  every menstrual period, or once a month.  Take over-the-counter and prescription medicines only as told by your health care provider.  Do not drive or use heavy machinery while taking prescription pain medicine.  Keep all follow-up visits as told by your health care provider. This is important. Contact a health care provider if:  You have bleeding that is heavier or lasts longer than a normal menstrual cycle.  You have a fever.  You have cramps or abdominal pain that get worse or do not get better with medicine.  You develop abdominal pain that is new or is not in the same area of earlier cramping and pain.  You feel lightheaded or weak.  You have abnormal or bad-smelling discharge from your vagina.  You have pain during sexual activity.  You have any of the following problems with your IUD string(s): ? The string bothers or hurts you or your sexual partner. ? You cannot feel the string. ? The string has gotten longer.  You can feel the IUD in your vagina.  You think you may be pregnant, or you miss your menstrual period.  You think you may have an STI (sexually transmitted infection). Get help right away if:  You have flu-like symptoms.  You have a fever and chills.  You can feel that your IUD has slipped out of place. Summary  After the procedure, it is common to have cramps and pain in the abdomen.  It is also common to have light bleeding (spotting) or heavier bleeding that is like your menstrual period.  Continue to check that the IUD is still in place by feeling for the string(s) after every menstrual period, or once a month.  Keep all follow-up visits as told by your health care provider. This is important.  Contact your health care provider if you have problems with your IUD string(s), such as the string getting longer or bothering you or your sexual partner. This information is not intended to replace advice given to you by your health care provider. Make sure  you discuss any questions you have with your health care provider. Document Released: 06/17/2011 Document Revised: 09/09/2016 Document Reviewed: 09/09/2016 Elsevier Interactive Patient Education  2019 [employer]

## 2019-04-07 NOTE — Progress Notes (Signed)
Post Partum Exam  Tiffany Bowman is a 28 y.o. G57P1011 female who presents for a postpartum visit. She is 4 weeks postpartum following a spontaneous vaginal delivery. I have fully reviewed the prenatal and intrapartum course. The delivery was at 40 gestational weeks.  Anesthesia: epidural. Postpartum course has been unremarkable. Baby's course has been unremarkable. Baby is feeding by breast. Bleeding staining only. Bowel function is normal. Bladder function is abnormal: incontinence. Patient is not sexually active. Contraception method is abstinence. Postpartum depression screening:neg (score 2)  The following portions of the patient's history were reviewed and updated as appropriate: allergies, current medications, past family history, past medical history, past social history, past surgical history and problem list. Last pap smear done 07/05/2018 and was Normal  Review of Systems Pertinent items are noted in HPI.    Objective:  Blood pressure 118/73, pulse 79, weight 151 lb 12.8 oz (68.9 kg), last menstrual period 06/09/2018, currently breastfeeding.  General:  alert, cooperative and appears stated age   Breasts:  not performed  Lungs: clear to auscultation bilaterally  Heart:  regular rate and rhythm, S1, S2 normal, no murmur, click, rub or gallop  Abdomen: soft, nontender   Vulva:  normal, small erythema at introitus from healing perineal tear, otherwise well healed without discharge or bleeding  Vagina: normal vagina  Cervix:  no lesions and normal cervical contour  Corpus: normal  Adnexa:  not evaluated  Rectal Exam: Not performed.         GYNECOLOGY CLINIC PROCEDURE NOTE  IUD Insertion Procedure Note Patient identified, informed consent performed.  Discussed risks of irregular bleeding, cramping, infection, malpositioning or misplacement of the IUD outside the uterus which may require further procedure such as laparoscopy. Time out was performed.  Urine pregnancy test  negative.  Speculum placed in the vagina.  Cervix visualized.  Cleaned with Betadine x 2.  Grasped anteriorly with a single tooth tenaculum.  Uterus sounded to 7 cm.  Paragard IUD placed per manufacturer's recommendations.  Strings trimmed to 3 cm. Tenaculum was removed, good hemostasis noted.  Patient tolerated procedure well.   Patient was given post-procedure instructions.  She was advised to be have backup contraception for one week.  Patient was also asked to check IUD strings periodically.   Assessment:   Normal postpartum exam. Pap smear not done at today's visit.  IUD insertion  Plan:   1. Contraception: IUD - Paragard 2. Follow up in: 1 year or sooner as needed.   Marny Lowenstein, PA-C  04/07/2019 8:39 AM

## 2019-07-14 IMAGING — US US MFM OB COMP +14 WKS
1 series · 14 of 28 positions shown · non-contrast
Comparison: none

[Series 1: us mfm ob comp +14 wks · 14 of 153 slices shown]
[im 6/153]
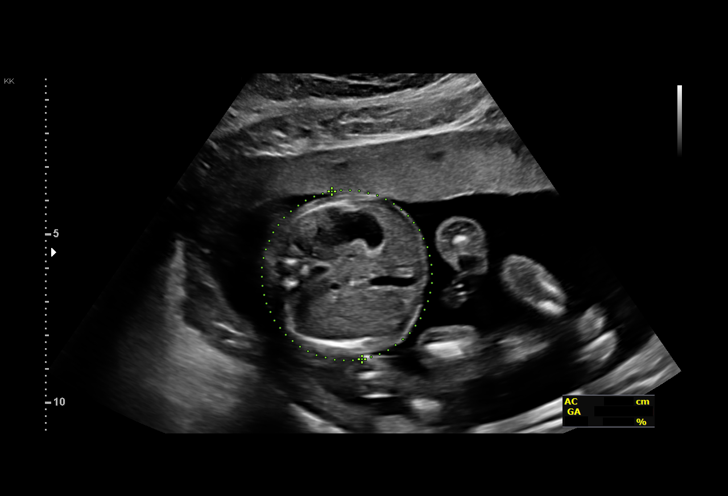
[im 17/153]
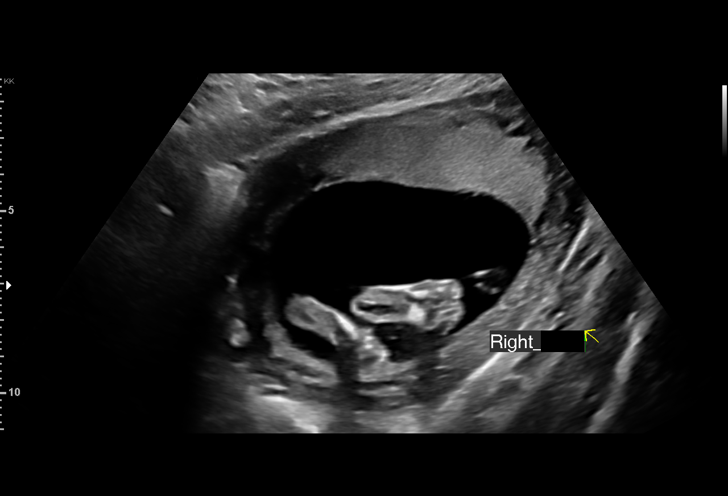
[im 29/153]
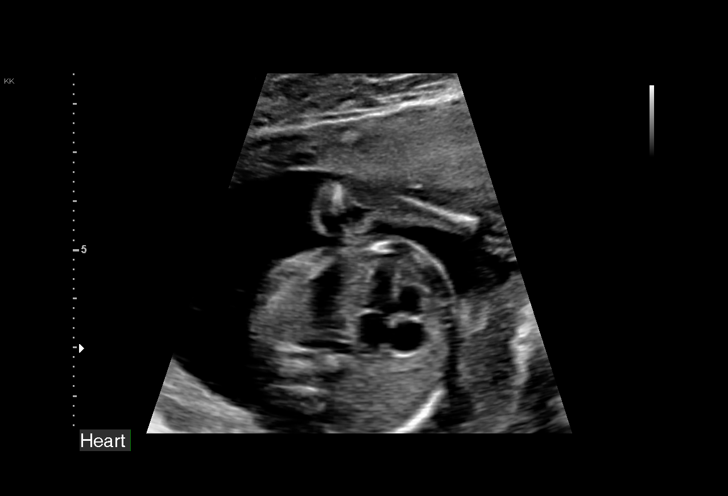
[im 40/153]
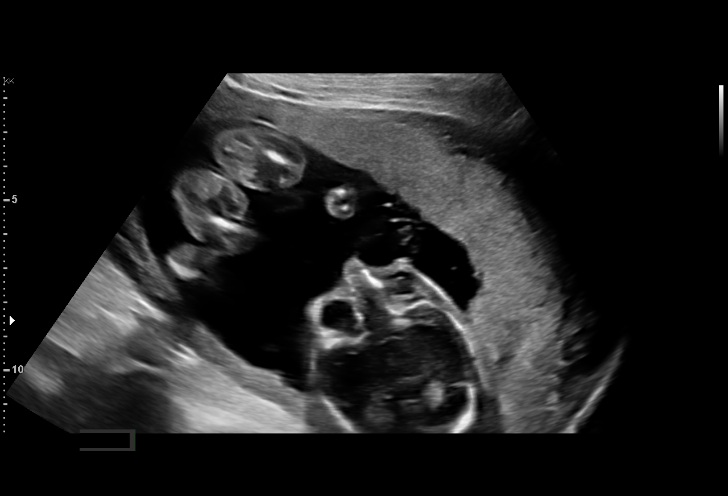
[im 51/153]
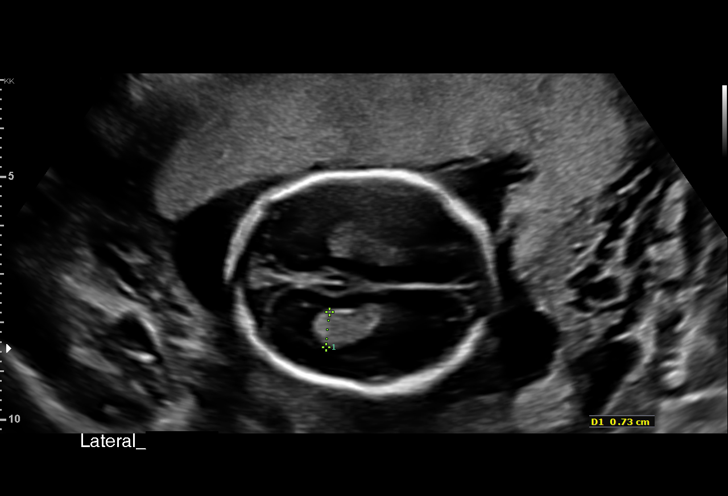
[im 62/153]
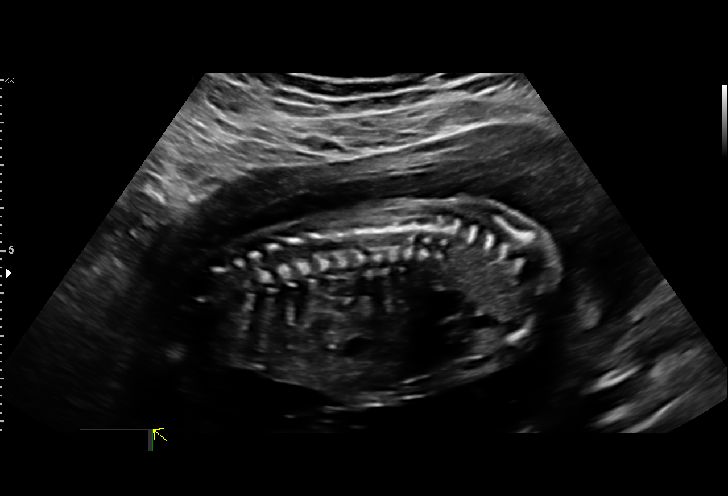
[im 74/153]
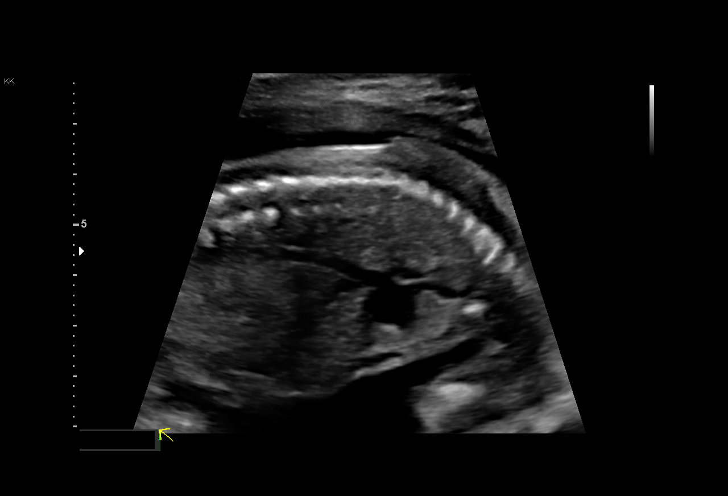
[im 85/153]
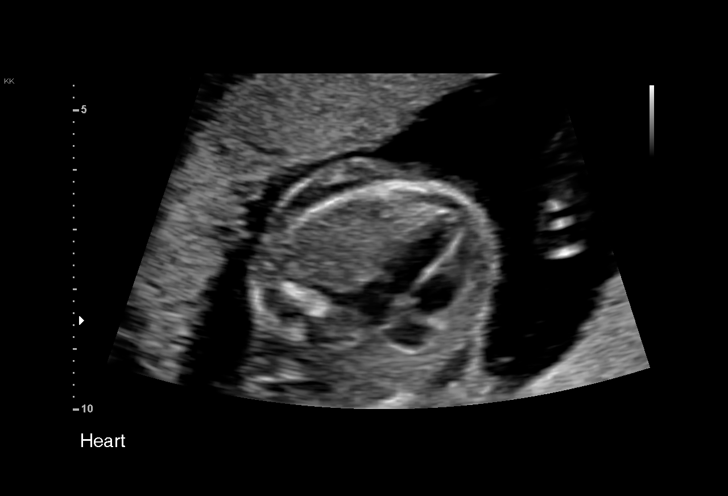
[im 96/153]
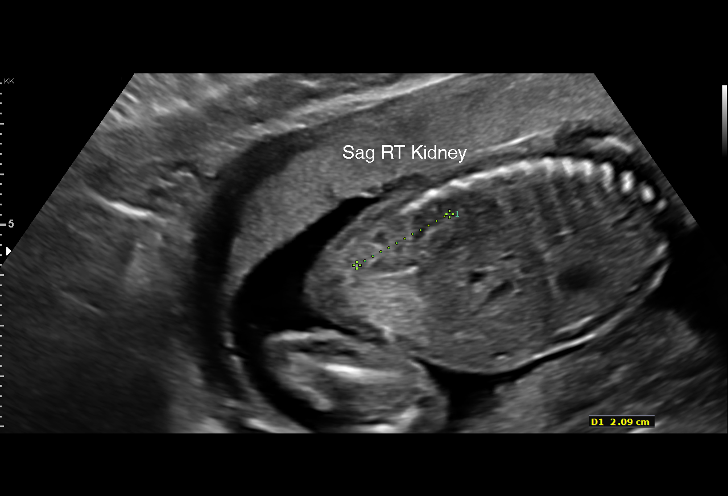
[im 107/153]
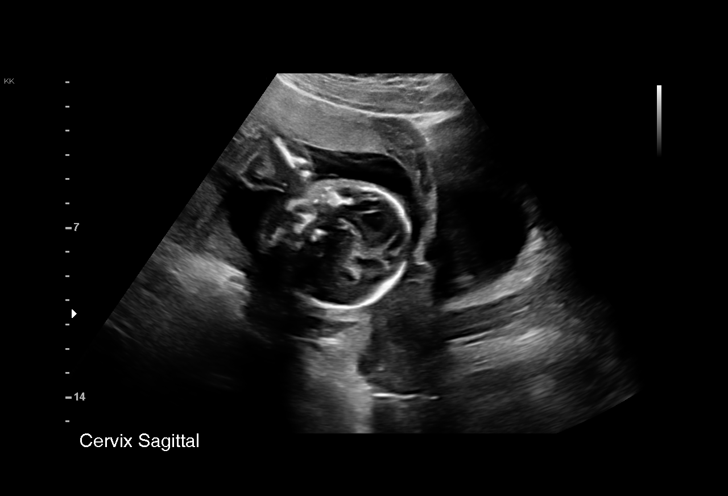
[im 119/153]
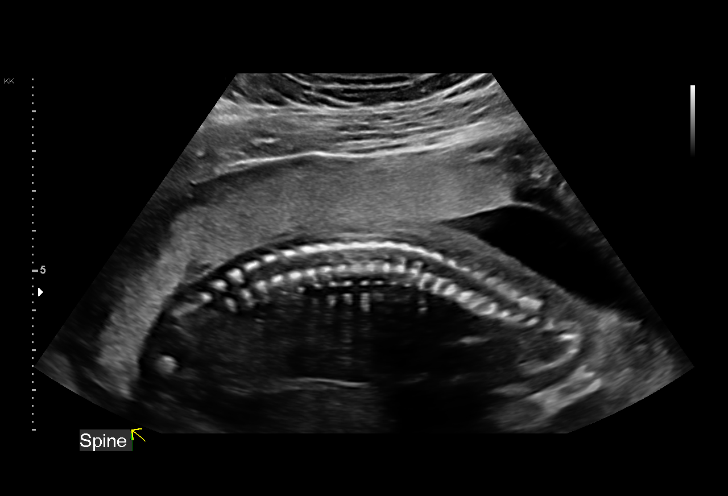
[im 130/153]
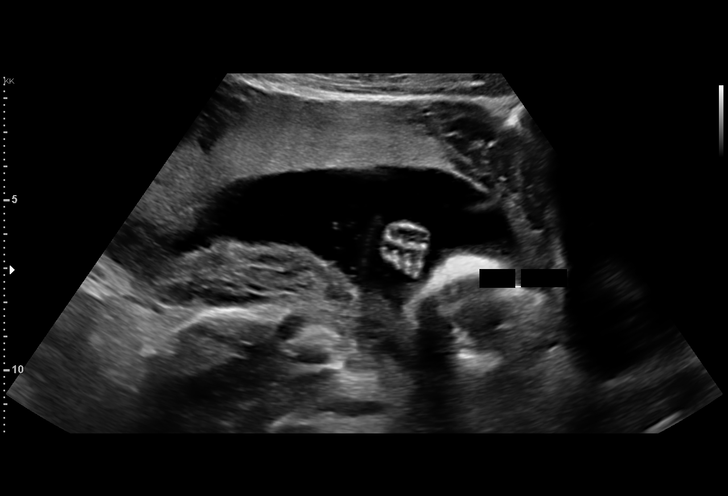
[im 141/153]
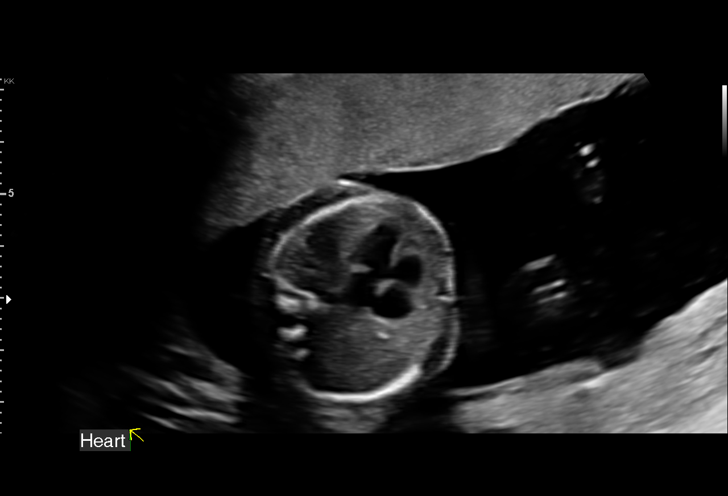
[im 153/153]
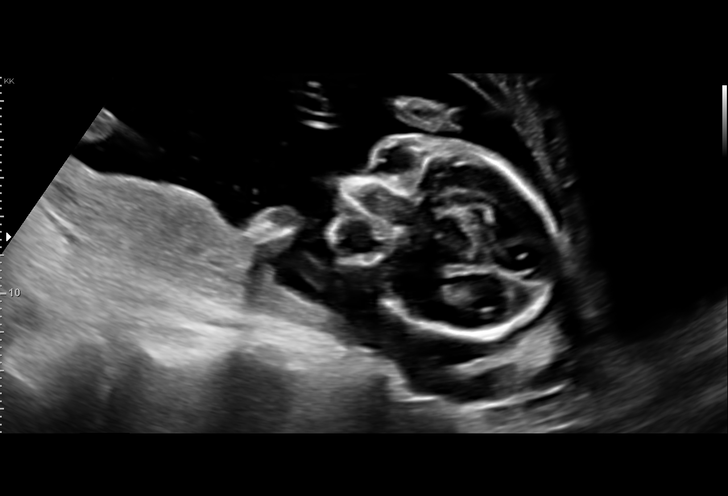

[14 of 28 positions shown; findings below may reference images not displayed]

[facility]yn Clinic
                   [id number]

  1  US MFM OB COMP + 14 WK               76805.01     SHIEDA YASIN
 ----------------------------------------------------------------------

 ----------------------------------------------------------------------
Indications

  Encounter for antenatal screening for
  malformations
  19 weeks gestation of pregnancy
 ----------------------------------------------------------------------
Fetal Evaluation

 Num Of Fetuses:          1
 Fetal Heart Rate(bpm):   143
 Cardiac Activity:        Observed
 Presentation:            Cephalic
 Placenta:                Anterior
 P. Cord Insertion:       Visualized

 Amniotic Fluid
 AFI FV:      Within normal limits

                             Largest Pocket(cm)

Biometry

 BPD:      46.8  mm     G. Age:  20w 1d         90  %    CI:        75.57   %    70 - 86
                                                         FL/HC:       18.2  %    16.1 -
 HC:      170.7  mm     G. Age:  19w 5d         74  %    HC/AC:       1.05       1.09 -
 AC:      162.2  mm     G. Age:  21w 2d       > 97  %    FL/BPD:      66.5  %
 FL:       31.1  mm     G. Age:  19w 4d         66  %    FL/AC:       19.2  %    20 - 24
 NFT:         3  mm

 Est. FW:     354   gm   0 lb 12 oz      66  %
OB History

 Gravidity:    1
Gestational Age

 LMP:           19w 0d        Date:  06/09/18                 EDD:   03/16/19
 U/S Today:     20w 1d                                        EDD:   03/08/19
 Best:          19w 0d     Det. By:  LMP  (06/09/18)          EDD:   03/16/19
Anatomy

 Cranium:               Appears normal         Aortic Arch:            Appears normal
 Cavum:                 Appears normal         Ductal Arch:            Appears normal
 Ventricles:            Appears normal         Diaphragm:              Appears normal
 Choroid Plexus:        Appears normal         Stomach:                Appears normal, left
                                                                       sided
 Cerebellum:            Appears normal         Abdomen:                Appears normal
 Posterior Fossa:       Appears normal         Abdominal Wall:         Appears nml (cord
                                                                       insert, abd wall)
 Nuchal Fold:           Appears normal         Cord Vessels:           Appears normal (3
                                                                       vessel cord)
 Face:                  Appears normal         Kidneys:                Appear normal
                        (orbits and profile)
 Lips:                  Appears normal         Bladder:                Appears normal
 Thoracic:              Appears normal         Spine:                  Appears normal
 Heart:                 Appears normal         Upper Extremities:      Appears normal
                        (4CH, axis, and situs
 RVOT:                  Appears normal         Lower Extremities:      Appears normal
 LVOT:                  Appears normal

 Other:  Female gender Heels and 5th digit visualized. Technically difficult due
         to fetal position.
Cervix Uterus Adnexa

 Cervix
 Length:            3.7  cm.
 Normal appearance by transabdominal scan.
Impression

 Normal interval growth.  No ultrasonic evidence of structural
 fetal anomalies.
 low risk AFP
 NIPS pending.
Recommendations

 Follow up as clinically indicated.

## 2019-12-01 IMAGING — US US MFM FETAL BPP WO NON STRESS
1 series · 15 of 28 positions shown · non-contrast
Comparison: none

[Series 1: us mfm fetal bpp wo non stress · 28 acquisitions, 15 frames shown]
[im 1/28]
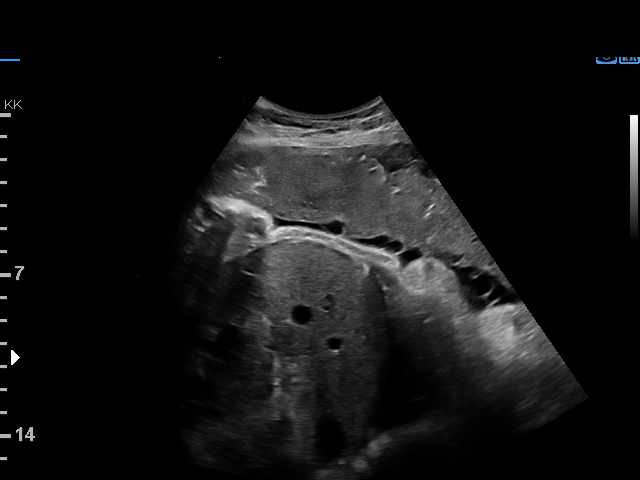
[im 3/28]
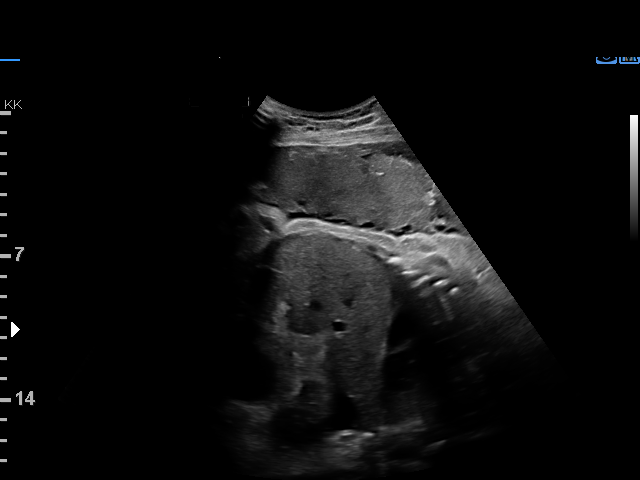
[im 5/28]
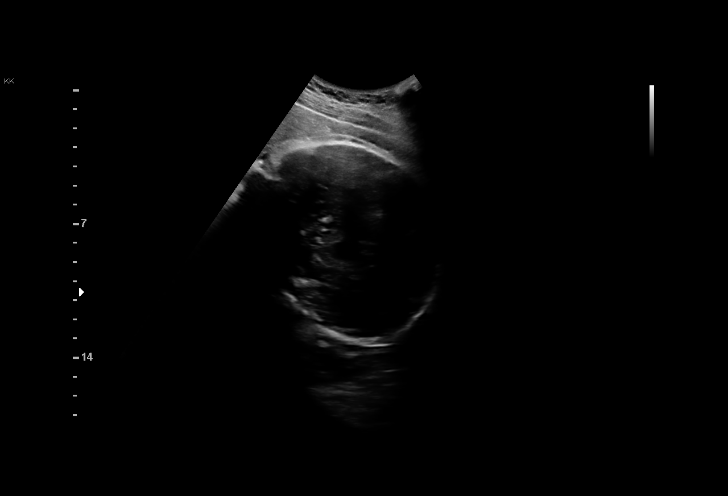
[im 7/28]
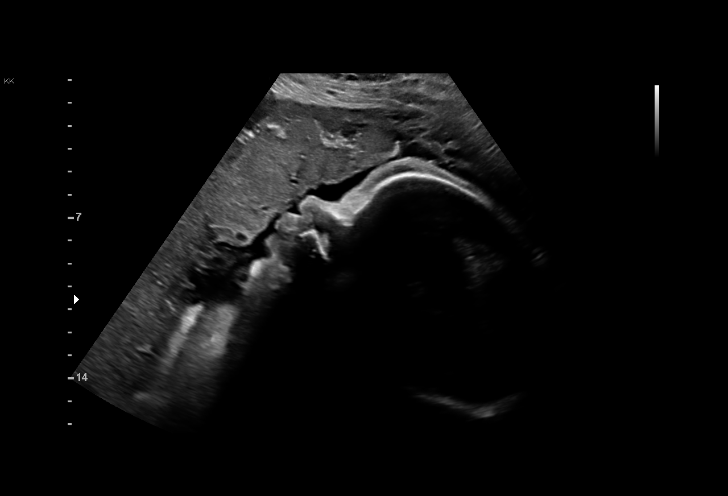
[im 9/28]
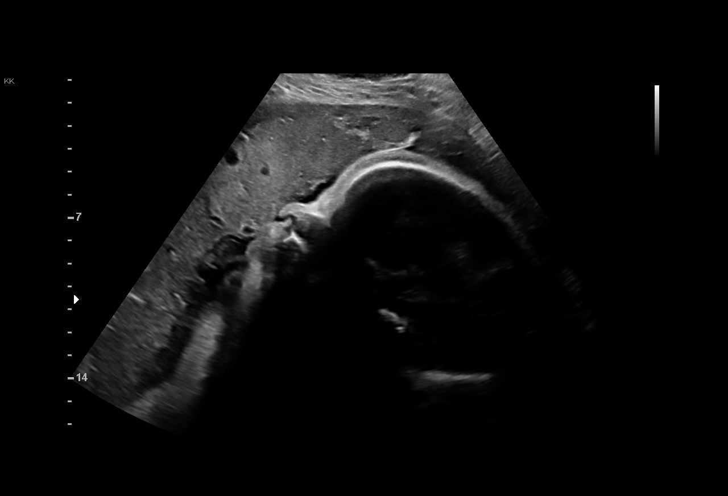
[im 11/28]
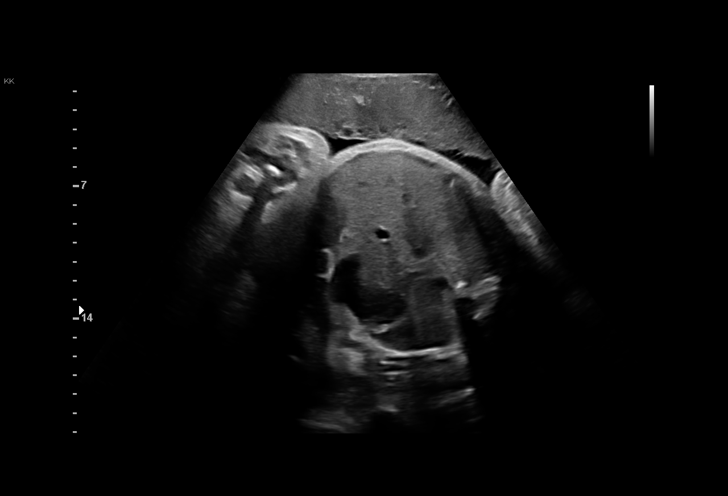
[im 13/28]
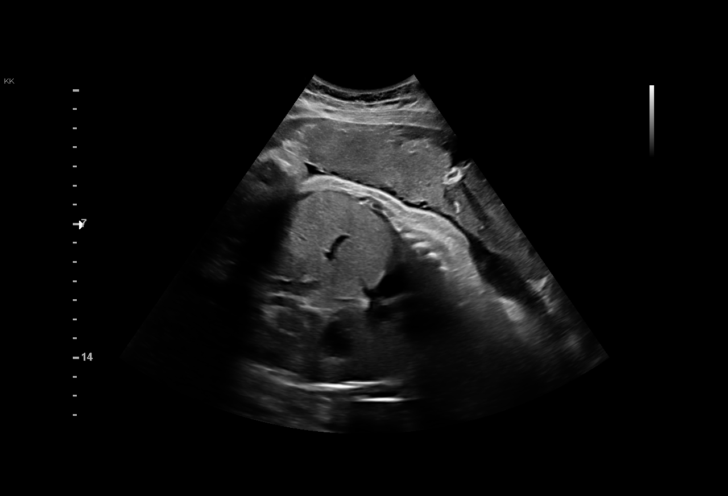
[im 15/28]
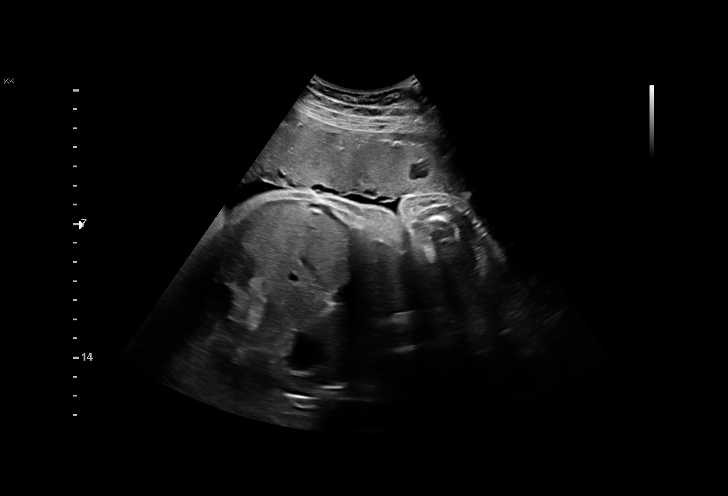
[im 16/28]
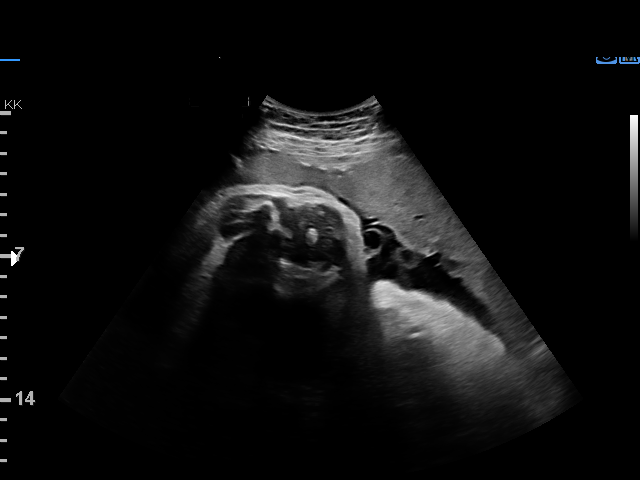
[im 18/28]
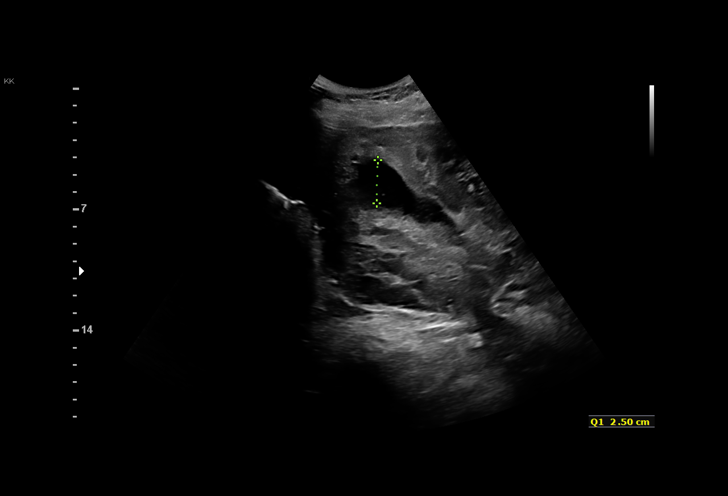
[im 20/28]
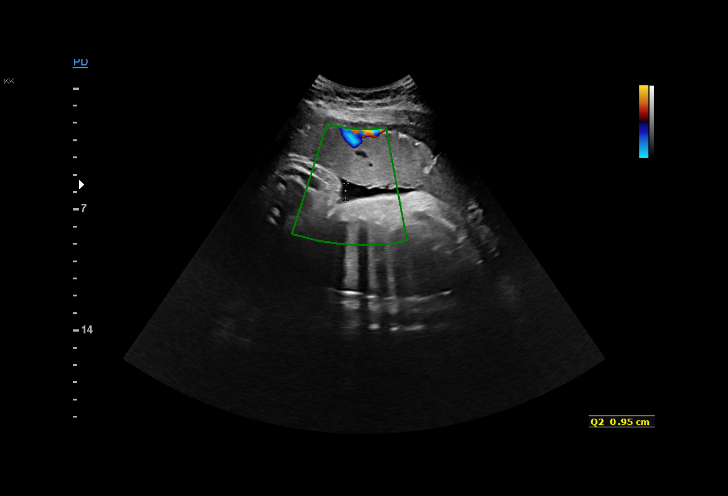
[im 22/28]
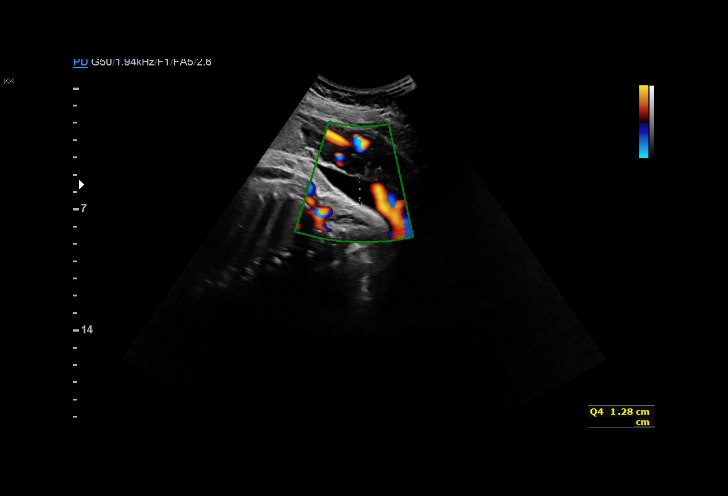
[im 24/28]
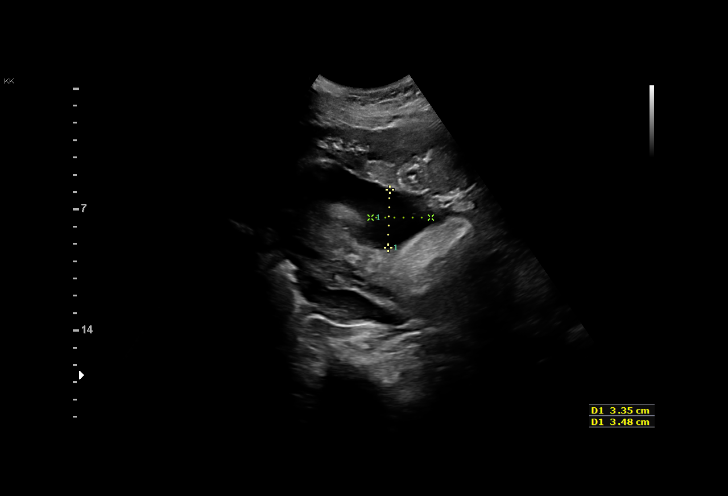
[im 26/28]
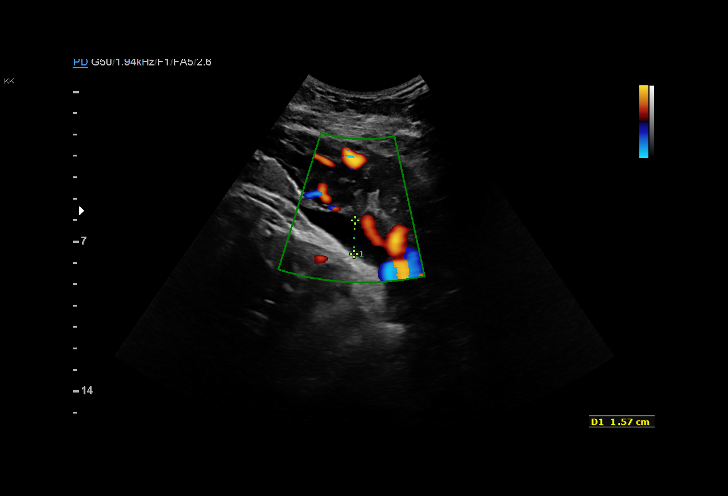
[im 28/28]
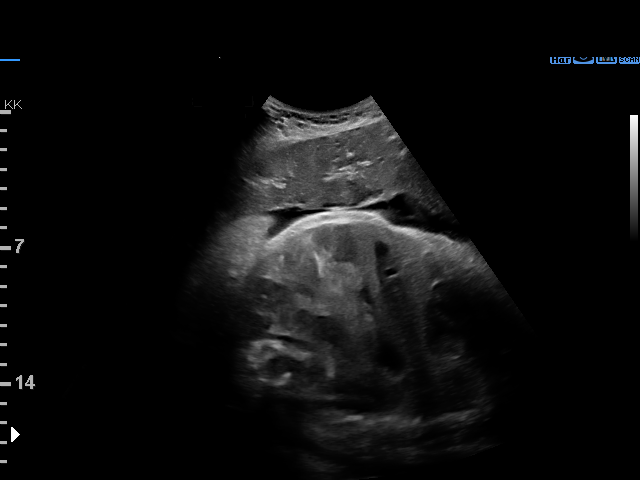

[15 of 28 positions shown; findings below may reference images not displayed]

[address]

 ----------------------------------------------------------------------

 ----------------------------------------------------------------------
Indications

  40 weeks gestation of pregnancy
  Postdate pregnancy (40-42 weeks)
 ----------------------------------------------------------------------
Vital Signs

 BMI:
Fetal Evaluation

 Num Of Fetuses:         1
 Fetal Heart Rate(bpm):  154
 Cardiac Activity:       Observed
 Presentation:           Cephalic
 Placenta:               Anterior
 P. Cord Insertion:      Previously Visualized

 Amniotic Fluid
 AFI FV:      Within normal limits

 AFI Sum(cm)     %Tile       Largest Pocket(cm)
 8.53            18

 RUQ(cm)       RLQ(cm)       LUQ(cm)        LLQ(cm)

Biophysical Evaluation
 Amniotic F.V:   Pocket => 2 cm two         F. Tone:        Observed
                 planes
 F. Movement:    Observed                   Score:          [DATE]
 F. Breathing:   Observed
OB History

 Gravidity:    1
Gestational Age

 LMP:           39w 0d        Date:  06/09/18                 EDD:   03/16/19
 Clinical EDD:  40w 1d                                        EDD:   03/08/19
 Best:          40w 1d     Det. By:  U/S  (10/20/18)          EDD:   03/08/19
Impression

 Patient is here for antenatal testing. Your office has amended
 her EDD to 03/08/2019.
 Amniotic fluid is normal and good fetal activity is seen.
 Antenatal testing is reassuring. BPP [DATE].
                 [place], [place]

## 2021-04-08 DIAGNOSIS — R7989 Other specified abnormal findings of blood chemistry: Secondary | ICD-10-CM | POA: Insufficient documentation

## 2022-12-18 ENCOUNTER — Encounter (HOSPITAL_COMMUNITY): Payer: Self-pay

## 2022-12-18 ENCOUNTER — Ambulatory Visit (HOSPITAL_COMMUNITY)
Admission: EM | Admit: 2022-12-18 | Discharge: 2022-12-18 | Disposition: A | Discharge status: Home / Self Care | Payer: 59 | Attending: Internal Medicine | Admitting: Internal Medicine

## 2022-12-18 DIAGNOSIS — J0100 Acute maxillary sinusitis, unspecified: Secondary | ICD-10-CM | POA: Diagnosis not present

## 2022-12-18 MED ORDER — BENZONATATE 200 MG PO CAPS: 200.0000 mg | ORAL_CAPSULE | Freq: Three times a day (TID) | ORAL | 0 refills | Status: DC | PRN

## 2022-12-18 MED ORDER — CEFDINIR 300 MG PO CAPS: 300.0000 mg | ORAL_CAPSULE | Freq: Two times a day (BID) | ORAL | 0 refills | Status: DC

## 2022-12-18 NOTE — ED Provider Notes (Signed)
[place]    CSN: [id number] Arrival date & time: 12/18/22  [id number]      History   Chief Complaint Chief Complaint  Patient presents with   Cough   Chills   Nasal Congestion   Hoarse    HPI Tiffany Bowman is a 32 y.o. female who presents with history of having non productive cough, nose congestion, sinus pressure blowing green mucous, chills and horsed x 5 days. She had influenza 2 weeks ago, and was feeling better for a week, before her symptoms got worse 5 days ago. She felt she had a low grade temp for a day 5 days ago.  Has been taking Sudafed, Mucinex, Advil and Tylenol and doing saline nose rinses.     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 Authorizing Provider  benzonatate (TESSALON) 200 MG capsule Take 1 capsule (200 mg total) by mouth 3 (three) times daily as needed for cough. 12/18/22  Yes Rodriguez-Southworth, Sunday Spillers, PA-C  cefdinir (OMNICEF) 300 MG capsule Take 1 capsule (300 mg total) by mouth 2 (two) times daily. 12/18/22  Yes Rodriguez-Southworth, Sunday Spillers, PA-C    Family History Family History  Problem Relation Age of Onset   Hypertension Mother    Glaucoma Father    Cancer Paternal Grandmother        breast cancer   Anxiety disorder Sister    Diabetes Maternal Grandmother      Social History Social History   Tobacco Use   Smoking status: Never   Smokeless tobacco: Never  Vaping Use   Vaping Use: Never used  Substance Use Topics   Alcohol use: Yes    Alcohol/week: 8.0 standard drinks of alcohol    Types: 8 Standard drinks or equivalent per week   Drug use: No     Allergies   Azithromycin, Prozac [fluoxetine hcl], and Penicillins   Review of Systems Review of Systems  Constitutional:  Positive for fatigue and fever. Negative for activity change and appetite change.  HENT:  Positive for congestion, postnasal drip and sinus pressure. Negative for ear discharge, ear pain and sore throat.   Eyes:  Negative for discharge.  Respiratory:  Positive for cough.   Musculoskeletal:  Negative for myalgias.  Neurological:  Negative for headaches.     Physical Exam Triage Vital Signs ED Triage Vitals [12/18/22 0944]  Enc Vitals Group     BP 125/84     Pulse Rate 82     Resp 16     Temp 98.3 F (36.8  C)     Temp Source Oral     SpO2 97 %     Weight      Height      Head Circumference      Peak Flow      Pain Score      Pain Loc      Pain Edu?      Excl. in [place]?    No data found.  Updated Vital Signs BP 125/84 (BP Location: Right Arm)   Pulse 82   Temp 98.3 F (36.8 C) (Oral)   Resp 16   SpO2 97%   Visual Acuity Right Eye Distance:   Left Eye Distance:   Bilateral Distance:    Right Eye Near:   Left Eye Near:    Bilateral Near:     Physical Exam Vitals and nursing note reviewed.  Constitutional:      General: She is not in acute distress.    Appearance: She is not toxic-appearing.  HENT:     Right Ear: Tympanic membrane, ear canal and external ear normal.     Left Ear: Tympanic membrane, ear canal and external ear normal.     Nose: Congestion present.     Mouth/Throat:     Mouth: Mucous membranes are moist.     Pharynx: Oropharynx is clear.  Eyes:     General: No scleral icterus.    Conjunctiva/sclera: Conjunctivae  normal.  Cardiovascular:     Rate and Rhythm: Normal rate and regular rhythm.  Pulmonary:     Effort: Pulmonary effort is normal.     Breath sounds: Normal breath sounds.  Musculoskeletal:        General: Normal range of motion.     Cervical back: Neck supple.  Lymphadenopathy:     Cervical: No cervical adenopathy.  Skin:    General: Skin is warm and dry.     Findings: No rash.  Neurological:     Mental Status: She is alert and oriented to person, place, and time.     Gait: Gait normal.  Psychiatric:        Mood and Affect: Mood normal.        Behavior: Behavior normal.        Thought Content: Thought content normal.        Judgment: Judgment normal.      UC Treatments / Results  Labs (all labs ordered are listed, but only abnormal results are displayed) Labs Reviewed - No data to display  EKG   Radiology No results found.  Procedures Procedures (including critical care time)  Medications Ordered in UC Medications - No data to display  Initial Impression / Assessment and Plan / UC Course  I have reviewed the triage vital signs and the nursing notes.  Acute sinusitis  I believe she has a secondary infection and I have placed her on Cefdinier and Tessalon as noted.   Final Clinical Impressions(s) / UC Diagnoses   Final diagnoses:  Acute non-recurrent maxillary sinusitis   Discharge Instructions   None    ED Prescriptions     Medication Sig Dispense Auth. Provider   cefdinir (OMNICEF) 300 MG capsule Take 1 capsule (300 mg total) by mouth 2 (two) times daily. 14 capsule Rodriguez-Southworth, Kayani Rapaport, PA-C   benzonatate (TESSALON) 200 MG capsule Take 1 capsule (200 mg total) by mouth 3 (three) times daily as needed for cough. 20 capsule Rodriguez-Southworth, Sunday Spillers, PA-C      PDMP not reviewed this encounter.  Shelby Mattocks, PA-C 12/18/22 1022

## 2022-12-18 NOTE — ED Triage Notes (Signed)
Patient reports that her family had the flu 2 weeks ago. Patient states she felt fine for a few days afterwards.  Patient states she now has a non productive cough, nasal congestion and sinus pressure with green mucous, chills, hoarse x 5 days.  Patient states she has been taking sudafed, Mucinex, Advil and Tylenol.  Patient states no meds taken today.

## 2023-01-14 DIAGNOSIS — Z124 Encounter for screening for malignant neoplasm of cervix: Secondary | ICD-10-CM | POA: Diagnosis not present

## 2023-01-14 LAB — PAP IG AND HPV HIGH-RISK
HPV Aptima: NEGATIVE
Pap: NEGATIVE

## 2023-01-19 LAB — HM PAP SMEAR: HPV, high-risk: NEGATIVE

## 2023-02-16 ENCOUNTER — Other Ambulatory Visit: Payer: Self-pay | Admitting: Physician Assistant

## 2023-02-16 ENCOUNTER — Ambulatory Visit: Payer: 59 | Admitting: Physician Assistant

## 2023-02-16 ENCOUNTER — Encounter: Payer: Self-pay | Admitting: Physician Assistant

## 2023-02-16 VITALS — BP 110/80 | HR 85 | Temp 97.8°F | Ht 65.0 in | Wt 188.0 lb

## 2023-02-16 DIAGNOSIS — E669 Obesity, unspecified: Secondary | ICD-10-CM

## 2023-02-16 DIAGNOSIS — Z0000 Encounter for general adult medical examination without abnormal findings: Secondary | ICD-10-CM | POA: Diagnosis not present

## 2023-02-16 DIAGNOSIS — R748 Abnormal levels of other serum enzymes: Secondary | ICD-10-CM

## 2023-02-16 DIAGNOSIS — E559 Vitamin D deficiency, unspecified: Secondary | ICD-10-CM | POA: Diagnosis not present

## 2023-02-16 LAB — LIPID PANEL
Cholesterol: 198 mg/dL (ref 0–200)
HDL: 57.6 mg/dL (ref >39.00)
LDL Cholesterol: 104 mg/dL — ABNORMAL HIGH (ref 0–99)
NonHDL: 140.44
Total CHOL/HDL Ratio: 3
Triglycerides: 183 mg/dL — ABNORMAL HIGH (ref 0.0–149.0)
VLDL: 36.6 mg/dL (ref 0.0–40.0)

## 2023-02-16 LAB — CBC WITH DIFFERENTIAL/PLATELET
Basophils Absolute: 0.1 10*3/uL (ref 0.0–0.1)
Basophils Relative: 0.8 % (ref 0.0–3.0)
Eosinophils Absolute: 0.2 10*3/uL (ref 0.0–0.7)
Eosinophils Relative: 2.5 % (ref 0.0–5.0)
HCT: 39.3 % (ref 36.0–46.0)
Hemoglobin: 13.2 g/dL (ref 12.0–15.0)
Lymphocytes Relative: 32.1 % (ref 12.0–46.0)
Lymphs Abs: 2.1 10*3/uL (ref 0.7–4.0)
MCHC: 33.6 g/dL (ref 30.0–36.0)
MCV: 89.2 fl (ref 78.0–100.0)
Monocytes Absolute: 0.4 10*3/uL (ref 0.1–1.0)
Monocytes Relative: 6.7 % (ref 3.0–12.0)
Neutro Abs: 3.9 10*3/uL (ref 1.4–7.7)
Neutrophils Relative %: 57.9 % (ref 43.0–77.0)
Platelets: 251 10*3/uL (ref 150.0–400.0)
RBC: 4.41 Mil/uL (ref 3.87–5.11)
RDW: 13 % (ref 11.5–15.5)
WBC: 6.7 10*3/uL (ref 4.0–10.5)

## 2023-02-16 LAB — IBC + FERRITIN
Ferritin: 23.1 ng/mL (ref 10.0–291.0)
Iron: 152 ug/dL — ABNORMAL HIGH (ref 42–145)
Saturation Ratios: 36.4 % (ref 20.0–50.0)
TIBC: 417.2 ug/dL (ref 250.0–450.0)
Transferrin: 298 mg/dL (ref 212.0–360.0)

## 2023-02-16 LAB — COMPREHENSIVE METABOLIC PANEL
ALT: 20 U/L (ref 0–35)
AST: 17 U/L (ref 0–37)
Albumin: 4.6 g/dL (ref 3.5–5.2)
Alkaline Phosphatase: 52 U/L (ref 39–117)
BUN: 14 mg/dL (ref 6–23)
CO2: 30 mEq/L (ref 19–32)
Calcium: 9.5 mg/dL (ref 8.4–10.5)
Chloride: 102 mEq/L (ref 96–112)
Creatinine, Ser: 0.71 mg/dL (ref 0.40–1.20)
GFR: 114.2 mL/min (ref >60.00)
Glucose, Bld: 107 mg/dL — ABNORMAL HIGH (ref 70–99)
Potassium: 4.5 mEq/L (ref 3.5–5.1)
Sodium: 140 mEq/L (ref 135–145)
Total Bilirubin: 0.5 mg/dL (ref 0.2–1.2)
Total Protein: 7.4 g/dL (ref 6.0–8.3)

## 2023-02-16 LAB — VITAMIN D 25 HYDROXY (VIT D DEFICIENCY, FRACTURES): VITD: 19.63 ng/mL — ABNORMAL LOW (ref 30.00–100.00)

## 2023-02-16 LAB — HEMOGLOBIN A1C: Hgb A1c MFr Bld: 5.5 % (ref 4.6–6.5)

## 2023-02-16 LAB — TSH: TSH: 1.3 u[IU]/mL (ref 0.35–5.50)

## 2023-02-16 MED ORDER — VITAMIN D (ERGOCALCIFEROL) 1.25 MG (50000 UNIT) PO CAPS: 50000.0000 [IU] | ORAL_CAPSULE | ORAL | 0 refills | Status: DC

## 2023-02-16 NOTE — Progress Notes (Signed)
Subjective:    Tiffany Bowman is a 32 y.o. female and is here to establish care and a comprehensive physical exam.  HPI  Health Maintenance Due  Topic Date Due   HPV VACCINES (2 - 3-dose series) 07/04/2009   Hepatitis C Screening  Never done   PAP SMEAR-Modifier  07/05/2021   Chief Complaint  Patient presents with   Establish Care   Annual Exam    Fasting    Acute Concerns: None  Chronic Issues: Liver enzymes elevated: In 2022, she reports an episode of severe upper abdominal pain while eating with her husband.  She states she has never experienced a pain like this.  After about 15 minutes the pain quickly resolved. Her labs at the time showed elevated liver enzymes.  She believes it may have been related to passing a gallstone.   Obesity She has been recently struggling to lose weight.  She notes she used to eat out often and has been trying to eat healthier meals at home.  She stays active by walking her dogs or walking while her daughter rides her scooter.  She has a new Photographer.   Health Maintenance: Immunizations -- UTD Colonoscopy -- N/A Mammogram -- N/A PAP -- Previous abnormal pap, positive HPV. Reports last pap about a month ago, states results were normal Bone Density -- N/A Diet -- Previously would eat out often. Recently has been trying to eat healthier. Exercise -- Stays active. New gym membership. Goes on walks with dogs and daughter.   Sleep habits -- no concerns Mood -- Stable  UTD with dentist? - yes UTD with eye doctor? - no  Weight history: Wt Readings from Last 10 Encounters:  02/16/23 188 lb (85.3 kg)  04/07/19 151 lb 12.8 oz (68.9 kg)  03/17/19 151 lb 12.8 oz (68.9 kg)  03/09/19 176 lb 11.2 oz (80.2 kg)  03/09/19 174 lb 12.8 oz (79.3 kg)  03/02/19 172 lb (78 kg)  02/23/19 168 lb 6.4 oz (76.4 kg)  02/09/19 166 lb (75.3 kg)  01/20/19 163 lb (73.9 kg)  01/02/19 165 lb 4.8 oz (75 kg)   Body mass index is 31.28  kg/m. Patient's last menstrual period was 02/07/2023 (exact date).  Alcohol use:  reports current alcohol use of about 2.0 standard drinks of alcohol per week.  Tobacco use:  Tobacco Use: Low Risk  (02/16/2023)   Patient History    Smoking Tobacco Use: Never    Smokeless Tobacco Use: Never    Passive Exposure: Not on file   Eligible for lung cancer screening? no     02/16/2023    9:41 AM  Depression screen PHQ 2/9  Decreased Interest 0  Down, Depressed, Hopeless 0  PHQ - 2 Score 0     Other providers/specialists: Patient Care Team: Jarold Motto, [place] as PCP - General (Physician Assistant) Ob/Gyn, Nestor Ramp as Consulting Physician (Obstetrics and Gynecology)    PMHx, SurgHx, SocialHx, Medications, and Allergies were reviewed in the Visit Navigator and updated as appropriate.   Past Medical History:  Diagnosis Date   Anxiety    HPV (human papilloma virus) infection    Vaginal delivery 03/2019   Vaginal Pap smear, abnormal      Past Surgical History:  Procedure Laterality Date   WISDOM TOOTH EXTRACTION       Family History  Problem Relation Age of Onset   Hypertension Mother    Glaucoma Father    Anxiety disorder Sister    Diabetes  Maternal Grandmother    Cancer Paternal Grandmother        breast cancer   Colon cancer Neg Hx     Social History   Tobacco Use   Smoking status: Never   Smokeless tobacco: Never  Vaping Use   Vaping Use: Never used  Substance Use Topics   Alcohol use: Yes    Alcohol/week: 2.0 standard drinks of alcohol    Types: 2 Glasses of wine per week   Drug use: Never    Review of Systems:   ROS  Objective:   BP 110/80 (BP Location: Left Arm, Patient Position: Sitting, Cuff Size: Large)   Pulse 85   Temp 97.8 F (36.6 C) (Temporal)   Ht 5\' 5"  (1.651 m)   Wt 188 lb (85.3 kg)   LMP 02/07/2023 (Exact Date)   SpO2 99%   Breastfeeding No   BMI 31.28 kg/m  Body mass index is 31.28 kg/m.   General Appearance:     Alert, cooperative, no distress, appears stated age  Head:    Normocephalic, without obvious abnormality, atraumatic  Eyes:    PERRL, conjunctiva/corneas clear, EOM's intact, fundi    benign, both eyes  Ears:    Normal TM's and external ear canals, both ears  Nose:   Nares normal, septum midline, mucosa normal, no drainage    or sinus tenderness  Throat:   Lips, mucosa, and tongue normal; teeth and gums normal  Neck:   Supple, symmetrical, trachea midline, no adenopathy;    thyroid:  no enlargement/tenderness/nodules; no carotid   bruit or JVD  Back:     Symmetric, no curvature, ROM normal, no CVA tenderness  Lungs:     Clear to auscultation bilaterally, respirations unlabored  Chest Wall:    No tenderness or deformity   Heart:    Regular rate and rhythm, S1 and S2 normal, no murmur, rub or gallop  Breast Exam:    Deferred   Abdomen:     Soft, non-tender, bowel sounds active all four quadrants,    no masses, no organomegaly  Genitalia:    Deferred   Extremities:   Extremities normal, atraumatic, no cyanosis or edema  Pulses:   2+ and symmetric all extremities  Skin:   Skin color, texture, turgor normal, no rashes or lesions  Lymph nodes:   Cervical, supraclavicular, and axillary nodes normal  Neurologic:   CNII-XII intact, normal strength, sensation and reflexes    throughout    Assessment/Plan:   Routine physical examination Today patient counseled on age appropriate routine health concerns for screening and prevention, each reviewed and up to date or declined. Immunizations reviewed and up to date or declined. Labs ordered and reviewed. Risk factors for depression reviewed and negative. Hearing function and visual acuity are intact. ADLs screened and addressed as needed. Functional ability and level of safety reviewed and appropriate. Education, counseling and referrals performed based on assessed risks today. Patient provided with a copy of personalized plan for preventive  services.  Obesity, unspecified classification, unspecified obesity type, unspecified whether serious comorbidity present Recommend healthy lifestyle as able  Vitamin D deficiency Update vitamin D and provide recommendations accordingly  Elevated liver enzymes Hx of this, will update this today   I,Rachel Rivera,acting as a scribe for Jarold Motto, PA.,have documented all relevant documentation on the behalf of Jarold Motto, PA,as directed by  Jarold Motto, PA while in the presence of Jarold Motto, [place].  I, Jarold Motto, [place], have reviewed all documentation for  this visit. The documentation on 02/16/23 for the exam, diagnosis, procedures, and orders are all accurate and complete.   Jarold Motto, PA-C [facility]

## 2023-02-16 NOTE — Patient Instructions (Signed)
It was great to see you!  *Please try to find an eye doctor *Work on consistent exercise  Please go to the lab for blood work.   Our office will call you with your results unless you have chosen to receive results via MyChart.  If your blood work is normal we will follow-up each year for physicals and as scheduled for chronic medical problems.  If anything is abnormal we will treat accordingly and get you in for a follow-up.  Take care,  Lelon Mast

## 2023-02-17 ENCOUNTER — Encounter: Payer: Self-pay | Admitting: Physician Assistant

## 2023-08-17 ENCOUNTER — Other Ambulatory Visit (INDEPENDENT_AMBULATORY_CARE_PROVIDER_SITE_OTHER): Payer: Medicaid Other

## 2023-08-17 ENCOUNTER — Ambulatory Visit: Payer: Medicaid Other | Admitting: *Deleted

## 2023-08-17 VITALS — BP 134/84 | HR 92 | Wt 194.0 lb

## 2023-08-17 DIAGNOSIS — Z3A09 9 weeks gestation of pregnancy: Secondary | ICD-10-CM

## 2023-08-17 DIAGNOSIS — Z3481 Encounter for supervision of other normal pregnancy, first trimester: Secondary | ICD-10-CM

## 2023-08-17 DIAGNOSIS — O3680X0 Pregnancy with inconclusive fetal viability, not applicable or unspecified: Secondary | ICD-10-CM

## 2023-08-17 DIAGNOSIS — Z3480 Encounter for supervision of other normal pregnancy, unspecified trimester: Secondary | ICD-10-CM | POA: Insufficient documentation

## 2023-08-17 NOTE — Progress Notes (Signed)
New OB Intake  I explained I am completing New OB Intake today. We discussed EDD of 03/16/2024, by Last Menstrual Period. Pt is G3P1011. I reviewed her allergies, medications and Medical/Surgical/OB history.    Patient Active Problem List   Diagnosis Date Noted   Supervision of other normal pregnancy, antepartum 08/17/2023   [redacted] weeks gestation of pregnancy 08/17/2023   Generalized anxiety disorder with panic attacks 03/07/2017   Lumbar herniated disc 06/01/2016    Concerns addressed today  Patient informed that the ultrasound is considered a limited obstetric ultrasound and is not intended to be a complete ultrasound exam.  Patient also informed that the ultrasound is not being completed with the intent of assessing for fetal or placental anomalies or any pelvic abnormalities. Explained that the purpose of today's ultrasound is to assess for viability.  Patient acknowledges the purpose of the exam and the limitations of the study.     Delivery Plans Plans to deliver at [facility]. Discussed the nature of our practice with multiple providers including residents and students. Due to the size of the practice, the delivering provider may not be the same as those providing prenatal care.   MyChart/Babyscripts MyChart access verified. I explained pt will have some visits in office and some virtually. Babyscripts app discussed and ordered.   Blood Pressure Cuff Blood pressure cuff discussed and given. Discussed to be used for virtual visits and or if needed BP checks weekly.  Anatomy US Explained first scheduled [place] will be around 19 weeks.   Last Pap 01/19/2023-WNL  First visit review I reviewed new OB appt with patient. Explained pt will be seen by Dr Vergie Living at first visit. Discussed Avelina Laine genetic screening with patient will get Panorama drawn at [facility] Routine prenatal labs to be collected at [facility].    Scheryl Marten, RN 08/17/2023  10:09 AM

## 2023-08-31 ENCOUNTER — Other Ambulatory Visit (HOSPITAL_COMMUNITY)
Admission: RE | Admit: 2023-08-31 | Discharge: 2023-08-31 | Disposition: A | Discharge status: Home / Self Care | Payer: Medicaid Other | Source: Ambulatory Visit | Attending: Obstetrics and Gynecology | Admitting: Obstetrics and Gynecology

## 2023-08-31 ENCOUNTER — Encounter: Payer: Self-pay | Admitting: Obstetrics & Gynecology

## 2023-08-31 ENCOUNTER — Ambulatory Visit: Payer: Medicaid Other | Admitting: Obstetrics & Gynecology

## 2023-08-31 VITALS — BP 129/86 | HR 101 | Wt 191.0 lb

## 2023-08-31 DIAGNOSIS — Z1339 Encounter for screening examination for other mental health and behavioral disorders: Secondary | ICD-10-CM | POA: Diagnosis not present

## 2023-08-31 DIAGNOSIS — Z3480 Encounter for supervision of other normal pregnancy, unspecified trimester: Secondary | ICD-10-CM | POA: Insufficient documentation

## 2023-08-31 DIAGNOSIS — Z3A11 11 weeks gestation of pregnancy: Secondary | ICD-10-CM | POA: Insufficient documentation

## 2023-08-31 DIAGNOSIS — O99210 Obesity complicating pregnancy, unspecified trimester: Secondary | ICD-10-CM | POA: Insufficient documentation

## 2023-08-31 DIAGNOSIS — O99810 Abnormal glucose complicating pregnancy: Secondary | ICD-10-CM | POA: Diagnosis not present

## 2023-08-31 DIAGNOSIS — O99211 Obesity complicating pregnancy, first trimester: Secondary | ICD-10-CM

## 2023-08-31 MED ORDER — ASPIRIN 81 MG PO TBEC: 81.0000 mg | DELAYED_RELEASE_TABLET | Freq: Every day | ORAL | 2 refills | Status: DC

## 2023-08-31 NOTE — Progress Notes (Signed)
History:   CAMREN WAHLERT is a 32 y.o. G3P1011 at [redacted]w[redacted]d by LMP, early ultrasound being seen today for her first obstetrical visit.  Her obstetrical history is significant for  term SVD in 2020 and one IAB, no pregnancy complications . Patient does intend to breast feed. Pregnancy history fully reviewed.  Patient reports no complaints.     HISTORY: OB History  Gravida Para Term Preterm AB Living  3 1 1  0 1 1  SAB IAB Ectopic Multiple Live Births  0 1 0 0 1    # Outcome Date GA Lbr Len/2nd Weight Sex Type Anes PTL Lv  3 Current           2 Term 03/10/19 [redacted]w[redacted]d 03:05 / 00:18 8 lb 13.3 oz (4.005 kg) F Vag-Spont EPI  LIV     Birth Comments: grunting and retractions in first hour of birth     Name: DALEIZA, STOPHER     Apgar1: 5  Apgar5: 8  1 IAB             Last pap smear was done 01/19/2023 and was normal  Past Medical History:  Diagnosis Date   Anxiety    HPV (human papilloma virus) infection    Lumbar herniated disc 06/01/2016   Overview:   Mild herniation L5     Vaginal delivery 03/2019   Vaginal Pap smear, abnormal    Past Surgical History:  Procedure Laterality Date   WISDOM TOOTH EXTRACTION     Family History  Problem Relation Age of Onset   Hypertension Mother    Glaucoma Father    Anxiety disorder Sister    Diabetes Maternal Grandmother    Cancer Paternal Grandmother        breast cancer   Colon cancer Neg Hx    Social History   Tobacco Use   Smoking status: Never   Smokeless tobacco: Never  Vaping Use   Vaping status: Never Used  Substance Use Topics   Alcohol use: Not Currently    Alcohol/week: 2.0 standard drinks of alcohol    Types: 2 Glasses of wine per week   Drug use: Never   Allergies  Allergen Reactions   Azithromycin Itching   Prozac [Fluoxetine Hcl] Itching    tching   Penicillins Rash   Current Outpatient Medications on File Prior to Visit  Medication Sig Dispense Refill   Prenatal Multivit-Min-Fe-FA (PRE-NATAL PO) Take 1  capsule by mouth daily in the afternoon.     No current facility-administered medications on file prior to visit.   Review of Systems Pertinent items noted in HPI and remainder of comprehensive ROS otherwise negative.   Physical Exam:   Vitals:   08/31/23 0840  BP: 129/86  Pulse: (!) 101  Weight: 191 lb (86.6 kg)   Fetal Heart Rate (bpm): 165   General: well-developed, well-nourished female in no acute distress  Breasts:  deferred   Skin: normal coloration and turgor, no rashes  Neurologic: oriented, normal, negative, normal mood  Extremities: normal strength, tone, and muscle mass, ROM of all joints is normal  HEENT PERRLA, extraocular movement intact and sclera clear, anicteric  Neck supple and no masses  Cardiovascular: regular rate and rhythm  Respiratory:  no respiratory distress, normal breath sounds  Abdomen: soft, non-tender; bowel sounds normal; no masses,  no organomegaly  Pelvic: deferred    Assessment:    Pregnancy: G3P1011 Patient Active Problem List   Diagnosis Date Noted   Obesity in  pregnancy, antepartum 08/31/2023   Supervision of other normal pregnancy, antepartum 08/17/2023   Generalized anxiety disorder with panic attacks 03/07/2017   Lumbar herniated disc 06/01/2016     Plan:    1. Obesity in pregnancy, antepartum Pregravid BMI 32, TWG so far -3 lbs.  TWG recommended about 11-20 lbs.  Surveillance labs done, ASA prescribed for preeclampsia prophylaxis.  - Hemoglobin A1c - Comprehensive metabolic panel - TSH Rfx on Abnormal to Free T4 - [place] MFM OB DETAIL +14 WK; Future - aspirin EC 81 MG tablet; Take 1 tablet (81 mg total) by mouth at bedtime. Start taking when you are [redacted] weeks pregnant for rest of pregnancy for prevention of preeclampsia  Dispense: 300 tablet; Refill: 2  2. [redacted] weeks gestation of pregnancy 3. Supervision of other normal pregnancy, antepartum - Culture, OB Urine - CBC/D/Plt+RPR+Rh+ABO+RubIgG... - Cervicovaginal ancillary only -  PANORAMA PRENATAL TEST - HORIZON CUSTOM - Hemoglobin A1c - Comprehensive metabolic panel - TSH Rfx on Abnormal to Free T4 - [place] MFM OB DETAIL +14 WK; Future - aspirin EC 81 MG tablet; Take 1 tablet (81 mg total) by mouth at bedtime. Start taking when you are [redacted] weeks pregnant for rest of pregnancy for prevention of preeclampsia  Dispense: 300 tablet; Refill: 2  Initial labs drawn. Borderline BPs so far, will continue to monitor closely. Patient has BP cuff, advised to take weekly. Aspirin prescribed for preeclampsia prophylaxis.  Continue prenatal vitamins. Problem list reviewed and updated. Genetic Screening discussed, Panorama and Horizon: ordered. Ultrasound discussed; fetal anatomic survey: planned. Anticipatory guidance about prenatal visits given including labs, ultrasounds, and testing. Discussed usage of the Babyscripts app for more information about pregnancy, and to track blood pressures. Patient was encouraged to use MyChart to review results, send requests, and have questions addressed.   The nature of Center for [facility] Healthcare/Faculty Practice with multiple MDs and Advanced Practice Providers was explained to patient; also emphasized that residents, students are part of our team. Routine obstetric precautions reviewed. Encouraged to seek out care at our office or emergency room [facility] preferred) for urgent and/or emergent concerns. Return in about 4 weeks (around 09/28/2023) for OFFICE OB VISIT (MD or APP).     Jaynie Collins, MD, FACOG Obstetrician & Gynecologist, [facility] for [employer], [facility]

## 2023-08-31 NOTE — Progress Notes (Signed)
Pt felt lightheaded, nausea and dizzy after her lab draw. Pt stayed in chair, comfort measures done.   BP 92/60  Repeat BPs after pt stated to feel better was 103/68 and 107/70.   Water and crackers given, pt was feeling better and after sitting for a few more minutes was able to leave.   Scheryl Marten, RN

## 2023-09-01 LAB — COMPREHENSIVE METABOLIC PANEL
ALT: 13 [IU]/L (ref 0–32)
AST: 16 [IU]/L (ref 0–40)
Albumin: 4.2 g/dL (ref 4.0–5.0)
Alkaline Phosphatase: 51 [IU]/L (ref 44–121)
BUN/Creatinine Ratio: 10 (ref 9–23)
BUN: 6 mg/dL (ref 6–20)
Bilirubin Total: <0.2 mg/dL (ref 0.0–1.2)
CO2: 22 mmol/L (ref 20–29)
Calcium: 9.8 mg/dL (ref 8.7–10.2)
Chloride: 102 mmol/L (ref 96–106)
Creatinine, Ser: 0.63 mg/dL (ref 0.57–1.00)
Globulin, Total: 2.6 g/dL (ref 1.5–4.5)
Glucose: 116 mg/dL — ABNORMAL HIGH (ref 70–99)
Potassium: 3.9 mmol/L (ref 3.5–5.2)
Sodium: 139 mmol/L (ref 134–144)
Total Protein: 6.8 g/dL (ref 6.0–8.5)
eGFR: 122 mL/min/{1.73_m2} (ref >59)

## 2023-09-01 LAB — CBC/D/PLT+RPR+RH+ABO+RUBIGG...
Antibody Screen: NEGATIVE
Basophils Absolute: 0 10*3/uL (ref 0.0–0.2)
Basos: 0 %
EOS (ABSOLUTE): 0.1 10*3/uL (ref 0.0–0.4)
Eos: 2 %
HCV Ab: NONREACTIVE
HIV Screen 4th Generation wRfx: NONREACTIVE
Hematocrit: 41.5 % (ref 34.0–46.6)
Hemoglobin: 13.2 g/dL (ref 11.1–15.9)
Hepatitis B Surface Ag: NEGATIVE
Immature Grans (Abs): 0 10*3/uL (ref 0.0–0.1)
Immature Granulocytes: 0 %
Lymphocytes Absolute: 1.5 10*3/uL (ref 0.7–3.1)
Lymphs: 20 %
MCH: 29.3 pg (ref 26.6–33.0)
MCHC: 31.8 g/dL (ref 31.5–35.7)
MCV: 92 fL (ref 79–97)
Monocytes Absolute: 0.4 10*3/uL (ref 0.1–0.9)
Monocytes: 5 %
Neutrophils Absolute: 5.3 10*3/uL (ref 1.4–7.0)
Neutrophils: 73 %
Platelets: 257 10*3/uL (ref 150–450)
RBC: 4.5 x10E6/uL (ref 3.77–5.28)
RDW: 12 % (ref 11.7–15.4)
RPR Ser Ql: NONREACTIVE
Rh Factor: POSITIVE
Rubella Antibodies, IGG: 14 {index} (ref >0.99)
WBC: 7.3 10*3/uL (ref 3.4–10.8)

## 2023-09-01 LAB — CERVICOVAGINAL ANCILLARY ONLY
Chlamydia: NEGATIVE
Comment: NEGATIVE
Comment: NEGATIVE
Comment: NORMAL
Neisseria Gonorrhea: NEGATIVE
Trichomonas: NEGATIVE

## 2023-09-01 LAB — HEMOGLOBIN A1C
Est. average glucose Bld gHb Est-mCnc: 117 mg/dL
Hgb A1c MFr Bld: 5.7 % — ABNORMAL HIGH (ref 4.8–5.6)

## 2023-09-01 LAB — TSH RFX ON ABNORMAL TO FREE T4: TSH: 0.526 u[IU]/mL (ref 0.450–4.500)

## 2023-09-01 LAB — HCV INTERPRETATION

## 2023-09-02 ENCOUNTER — Encounter: Payer: 59 | Admitting: Obstetrics and Gynecology

## 2023-09-02 DIAGNOSIS — O99810 Abnormal glucose complicating pregnancy: Secondary | ICD-10-CM | POA: Insufficient documentation

## 2023-09-02 LAB — CULTURE, OB URINE

## 2023-09-02 LAB — URINE CULTURE, OB REFLEX

## 2023-09-02 NOTE — Addendum Note (Signed)
Addended by: Jaynie Collins A on: 09/02/2023 09:49 PM   Modules accepted: Orders

## 2023-09-03 ENCOUNTER — Other Ambulatory Visit: Payer: Self-pay

## 2023-09-03 DIAGNOSIS — O219 Vomiting of pregnancy, unspecified: Secondary | ICD-10-CM

## 2023-09-03 MED ORDER — ONDANSETRON 4 MG PO TBDP: 4.0000 mg | ORAL_TABLET | Freq: Four times a day (QID) | ORAL | 0 refills | Status: DC | PRN

## 2023-09-05 LAB — PANORAMA PRENATAL TEST FULL PANEL:PANORAMA TEST PLUS 5 ADDITIONAL MICRODELETIONS: FETAL FRACTION: 7.5

## 2023-09-07 LAB — HORIZON CUSTOM: REPORT SUMMARY: NEGATIVE

## 2023-09-09 ENCOUNTER — Other Ambulatory Visit: Payer: Self-pay

## 2023-09-09 ENCOUNTER — Other Ambulatory Visit: Payer: Medicaid Other

## 2023-09-09 DIAGNOSIS — Z3480 Encounter for supervision of other normal pregnancy, unspecified trimester: Secondary | ICD-10-CM

## 2023-09-09 DIAGNOSIS — O99810 Abnormal glucose complicating pregnancy: Secondary | ICD-10-CM

## 2023-09-09 NOTE — Progress Notes (Signed)
Error

## 2023-09-09 NOTE — Progress Notes (Signed)
Pt requested her  B/P be taken after GTT. I sent you a message on pt earlier this morning.   This is just a FYI.  Thanks.   B/P:115/80 P: 105

## 2023-09-10 LAB — GLUCOSE TOLERANCE, 2 HOURS W/ 1HR
Glucose, 1 hour: 178 mg/dL (ref 70–179)
Glucose, 2 hour: 106 mg/dL (ref 70–152)
Glucose, Fasting: 88 mg/dL (ref 70–91)

## 2023-09-13 ENCOUNTER — Encounter: Payer: Self-pay | Admitting: Obstetrics & Gynecology

## 2023-10-05 ENCOUNTER — Encounter: Payer: Self-pay | Admitting: Obstetrics & Gynecology

## 2023-10-05 ENCOUNTER — Ambulatory Visit (INDEPENDENT_AMBULATORY_CARE_PROVIDER_SITE_OTHER): Payer: Medicaid Other | Admitting: Obstetrics & Gynecology

## 2023-10-05 VITALS — BP 126/82 | HR 103 | Wt 185.0 lb

## 2023-10-05 DIAGNOSIS — E669 Obesity, unspecified: Secondary | ICD-10-CM

## 2023-10-05 DIAGNOSIS — O99212 Obesity complicating pregnancy, second trimester: Secondary | ICD-10-CM

## 2023-10-05 DIAGNOSIS — O99891 Other specified diseases and conditions complicating pregnancy: Secondary | ICD-10-CM

## 2023-10-05 DIAGNOSIS — F413 Other mixed anxiety disorders: Secondary | ICD-10-CM

## 2023-10-05 DIAGNOSIS — Z23 Encounter for immunization: Secondary | ICD-10-CM | POA: Diagnosis not present

## 2023-10-05 DIAGNOSIS — O09292 Supervision of pregnancy with other poor reproductive or obstetric history, second trimester: Secondary | ICD-10-CM

## 2023-10-05 DIAGNOSIS — Z3480 Encounter for supervision of other normal pregnancy, unspecified trimester: Secondary | ICD-10-CM

## 2023-10-05 DIAGNOSIS — M5126 Other intervertebral disc displacement, lumbar region: Secondary | ICD-10-CM

## 2023-10-05 DIAGNOSIS — Z8759 Personal history of other complications of pregnancy, childbirth and the puerperium: Secondary | ICD-10-CM

## 2023-10-05 DIAGNOSIS — Z3A16 16 weeks gestation of pregnancy: Secondary | ICD-10-CM

## 2023-10-05 DIAGNOSIS — O99342 Other mental disorders complicating pregnancy, second trimester: Secondary | ICD-10-CM

## 2023-10-05 MED ORDER — ASPIRIN 81 MG PO TBEC: 162.0000 mg | DELAYED_RELEASE_TABLET | Freq: Every day | ORAL | 2 refills | Status: DC

## 2023-10-05 NOTE — Progress Notes (Signed)
   PRENATAL VISIT NOTE  Subjective:  Tiffany Bowman is a 32 y.o. G3P1011 at [redacted]w[redacted]d being seen today for ongoing prenatal care.  She is currently monitored for the following issues for this low-risk pregnancy and has Lumbar herniated disc; Generalized anxiety disorder with panic attacks; History of gestational hypertension; Supervision of other normal pregnancy, antepartum; Obesity in pregnancy, antepartum; and Prediabetes in mother during pregnancy on their problem list.  Patient reports no complaints.  Contractions: Not present. Vag. Bleeding: None.  Movement: Present. Denies leaking of fluid.   The following portions of the patient's history were reviewed and updated as appropriate: allergies, current medications, past family history, past medical history, past social history, past surgical history and problem list.   Objective:   Vitals:   10/05/23 0932  BP: 126/82  Pulse: (!) 103  Weight: 185 lb (83.9 kg)    Fetal Status: Fetal Heart Rate (bpm): 153   Movement: Present     General:  Alert, oriented and cooperative. Patient is in no acute distress.  Skin: Skin is warm and dry. No rash noted.   Cardiovascular: Normal heart rate noted  Respiratory: Normal respiratory effort, no problems with respiration noted  Abdomen: Soft, gravid, appropriate for gestational age.  Pain/Pressure: Absent (With Movement of the baby)     Pelvic: Cervical exam deferred        Extremities: Normal range of motion.  Edema: None  Mental Status: Normal mood and affect. Normal behavior. Normal judgment and thought content.   Assessment and Plan:  Pregnancy: G3P1011 at [redacted]w[redacted]d 1. History of gestational hypertension Stable BP, continue home checks as instructed. On ASA. - aspirin EC 81 MG tablet; Take 2 tablets (162 mg total) by mouth at bedtime. Start taking when you are [redacted] weeks pregnant for rest of pregnancy for prevention of preeclampsia  Dispense: 300 tablet; Refill: 2  2. Flu vaccine need - Flu  vaccine trivalent PF, 6mos and older(Flulaval,Afluria,Fluarix,Fluzone) given  3. [redacted] weeks gestation of pregnancy 4. Supervision of other normal pregnancy, antepartum Declined AFP screen.  Has LR NIPS.  Anatomy scan scheduled. No other complaints or concerns.  Routine obstetric precautions reviewed.   Please refer to After Visit Summary for other counseling recommendations.   Return in about 4 weeks (around 11/02/2023) for OFFICE OB VISIT (MD only).  Future Appointments  Date Time Provider Department Center  10/25/2023 10:15 AM [facility] NURSE [facility]  10/25/2023 10:30 AM [facility] US3 [facility]  11/02/2023 10:35 AM Kais Monje, Jethro Bastos, MD [facility]  11/30/2023 10:35 AM [place] Bing, MD [facility]    Jaynie Collins, MD

## 2023-10-25 ENCOUNTER — Ambulatory Visit: Payer: Medicaid Other | Admitting: *Deleted

## 2023-10-25 ENCOUNTER — Ambulatory Visit: Payer: Medicaid Other | Attending: Obstetrics & Gynecology

## 2023-10-25 ENCOUNTER — Encounter: Payer: Self-pay | Admitting: *Deleted

## 2023-10-25 ENCOUNTER — Other Ambulatory Visit: Payer: Self-pay | Admitting: *Deleted

## 2023-10-25 VITALS — BP 135/72 | HR 87

## 2023-10-25 DIAGNOSIS — O99210 Obesity complicating pregnancy, unspecified trimester: Secondary | ICD-10-CM

## 2023-10-25 DIAGNOSIS — O09292 Supervision of pregnancy with other poor reproductive or obstetric history, second trimester: Secondary | ICD-10-CM | POA: Diagnosis not present

## 2023-10-25 DIAGNOSIS — Z3480 Encounter for supervision of other normal pregnancy, unspecified trimester: Secondary | ICD-10-CM

## 2023-10-25 DIAGNOSIS — O09299 Supervision of pregnancy with other poor reproductive or obstetric history, unspecified trimester: Secondary | ICD-10-CM | POA: Diagnosis not present

## 2023-10-25 DIAGNOSIS — Z3A19 19 weeks gestation of pregnancy: Secondary | ICD-10-CM | POA: Insufficient documentation

## 2023-10-25 DIAGNOSIS — Z3A11 11 weeks gestation of pregnancy: Secondary | ICD-10-CM

## 2023-10-25 DIAGNOSIS — Z363 Encounter for antenatal screening for malformations: Secondary | ICD-10-CM | POA: Insufficient documentation

## 2023-10-25 DIAGNOSIS — O99212 Obesity complicating pregnancy, second trimester: Secondary | ICD-10-CM | POA: Insufficient documentation

## 2023-10-25 DIAGNOSIS — Z8759 Personal history of other complications of pregnancy, childbirth and the puerperium: Secondary | ICD-10-CM

## 2023-10-25 DIAGNOSIS — E669 Obesity, unspecified: Secondary | ICD-10-CM

## 2023-11-02 ENCOUNTER — Encounter: Payer: Self-pay | Admitting: Obstetrics & Gynecology

## 2023-11-02 ENCOUNTER — Encounter: Payer: Medicaid Other | Admitting: Obstetrics & Gynecology

## 2023-11-02 ENCOUNTER — Ambulatory Visit (INDEPENDENT_AMBULATORY_CARE_PROVIDER_SITE_OTHER): Payer: Medicaid Other | Admitting: Obstetrics & Gynecology

## 2023-11-02 VITALS — BP 122/80 | HR 101 | Wt 184.0 lb

## 2023-11-02 DIAGNOSIS — Z3480 Encounter for supervision of other normal pregnancy, unspecified trimester: Secondary | ICD-10-CM

## 2023-11-02 DIAGNOSIS — Z8759 Personal history of other complications of pregnancy, childbirth and the puerperium: Secondary | ICD-10-CM

## 2023-11-02 DIAGNOSIS — Z3A20 20 weeks gestation of pregnancy: Secondary | ICD-10-CM

## 2023-11-02 NOTE — Progress Notes (Signed)
 PRENATAL VISIT NOTE  Subjective:  Tiffany Bowman is a 32 y.o. G3P1011 at [redacted]w[redacted]d being seen today for ongoing prenatal care.  Here with FOB. She is currently monitored for the following issues for this low-risk pregnancy and has Generalized anxiety disorder with panic attacks; History of gestational hypertension; Supervision of high risk pregnancy; Obesity in pregnancy, antepartum; and Prediabetes in mother during pregnancy on their problem list.  Patient reports no complaints.  Contractions: Not present. Vag. Bleeding: None.  Movement: Present. Denies leaking of fluid.   The following portions of the patient's history were reviewed and updated as appropriate: allergies, current medications, past family history, past medical history, past social history, past surgical history and problem list.   Objective:   Vitals:   11/02/23 1042  BP: 122/80  Pulse: (!) 101  Weight: 184 lb (83.5 kg)    Fetal Status: Fetal Heart Rate (bpm): 153   Movement: Present     General:  Alert, oriented and cooperative. Patient is in no acute distress.  Skin: Skin is warm and dry. No rash noted.   Cardiovascular: Normal heart rate noted  Respiratory: Normal respiratory effort, no problems with respiration noted  Abdomen: Soft, gravid, appropriate for gestational age.  Pain/Pressure: Absent     Pelvic: Cervical exam deferred        Extremities: Normal range of motion.  Edema: Trace  Mental Status: Normal mood and affect. Normal behavior. Normal judgment and thought content.   US  MFM OB DETAIL +14 WK Result Date: 10/25/2023 ----------------------------------------------------------------------  OBSTETRICS REPORT                       (Signed Final 10/25/2023 01:10 pm) ---------------------------------------------------------------------- Patient Info  ID #:       [id number]                          D.O.B.:  Mar 12, 1991 (31 yrs)(F)  Name:       Tiffany Bowman [facility]                Visit Date: 10/25/2023 07:57 am  ---------------------------------------------------------------------- Performed By  Attending:        Fredia Fresh MD        Ref. Address:     [address]                                                             [address]  Performed By:     Jonette Nap        Location[facility]         [facility]  Referred By:      [facility] ---------------------------------------------------------------------- Orders  #  Description                           Code        Ordered By  1  US  MFM OB DETAIL +14 WK               [id number]    GLORIS HUGGER ----------------------------------------------------------------------  #  Order #                     Accession #                Episode #  1  [id number]                   [id number]                 [id number] ---------------------------------------------------------------------- Indications  Obesity complicating pregnancy, second         O99.212  trimester (32)  Poor obstetric history: Previous GHTN          O09.299  Encounter for antenatal screening for          Z36.3  malformations  [redacted] weeks gestation of pregnancy                Z3A.19  LR female, neg horizon ---------------------------------------------------------------------- Fetal Evaluation  Num Of Fetuses:         1  Fetal Heart Rate(bpm):  136  Cardiac Activity:       Observed  Presentation:           Cephalic  Placenta:               Anterior  P. Cord Insertion:      Not well visualized  Amniotic Fluid  AFI FV:      Within normal limits                              Largest Pocket(cm)                              5.25 ---------------------------------------------------------------------- Biometry  BPD:      45.4  mm     G. Age:  19w 5d         57  %    CI:        74.54   %    70 - 86                                                           FL/HC:      17.3   %    16.8 - 19.8  HC:      166.9  mm  G. Age:  19w 3d         33  %    HC/AC:      1.08        1.09 - 1.39  AC:      154.1  mm     G. Age:  20w 4d         78  %    FL/BPD:     63.7   %  FL:       28.9  mm     G. Age:  18w 6d         20  %    FL/AC:      18.8   %    20 - 24  HUM:      28.4  mm     G. Age:  19w 1d         42  %  CER:      19.3  mm     G. Age:  18w 6d         31  %  NFT:       4.2  mm  LV:        8.4  mm  CM:        6.2  mm  Est. FW:     311  gm    0 lb 11 oz      56  % ---------------------------------------------------------------------- OB History  Gravidity:    3         Term:   1        Prem:   0        SAB:   0  TOP:          1       Ectopic:  0        Living: 1 ---------------------------------------------------------------------- Gestational Age  LMP:           19w 4d        Date:  06/10/23                   EDD:   03/16/24  U/S Today:     19w 5d                                        EDD:   03/15/24  Best:          19w 4d     Det. By:  LMP  (06/10/23)          EDD:   03/16/24 ---------------------------------------------------------------------- Targeted Anatomy  Central Nervous System  Calvarium/Cranial V.:  Appears normal         Cereb./Vermis:          Appears normal  Cavum:                 Appears normal         Cisterna Magna:         Appears normal  Lateral Ventricles:    Appears normal         Midline Falx:           Appears normal  Choroid Plexus:        Appears normal  Spine  Cervical:              Appears normal  Sacral:                 Appears normal  Thoracic:              Appears normal         Shape/Curvature:        Appears normal  Lumbar:                Appears normal  Head/Neck  Lips:                  Appears normal         Profile:                Appears normal  Neck:                  Appears normal         Orbits/Eyes:            Appears normal  Nuchal Fold:           Appears normal         Mandible:                Not well visualized  Nasal Bone:            Not well visualized    Maxilla:                Not well visualized  Thorax  4 Chamber View:        Appears normal         Interventr. Septum:     Not well visualized  Cardiac Rhythm:        Normal                 Cardiac Axis:           Normal  Cardiac Situs:         Appears normal         Diaphragm:              Appears normal  Rt Outflow Tract:      Appears normal         3 Vessel View:          Appears normal  Lt Outflow Tract:      Appears normal         3 V Trachea View:       Appears normal  Aortic Arch:           Appears normal         IVC:                    Appears normal  Ductal Arch:           Appears normal         Crossing:               Appears normal  SVC:                   Appears normal  Abdomen  Ventral Wall:          Appears normal         Lt Kidney:              Appears normal  Cord Insertion:        Appears normal         Rt Kidney:  Appears normal  Situs:                 Appears normal         Bladder:                Appears normal  Stomach:               Appears normal  Extremities  Lt Humerus:            Appears normal         Lt Femur:               Appears normal  Rt Humerus:            Appears normal         Rt Femur:               Appears normal  Lt Forearm:            Appears normal         Lt Lower Leg:           Appears normal  Rt Forearm:            Appears normal         Rt Lower Leg:           Appears normal  Lt Hand:               Open hand nml          Lt Foot:                Nml heel/foot  Rt Hand:               Open hand nml          Rt Foot:                Nml heel/foot  Other  Umbilical Cord:        Normal 3-vessel        Genitalia:              Female-nml  Comment:     Technically difficult due to maternal habitus and fetal position. ---------------------------------------------------------------------- Cervix Uterus Adnexa  Cervix  Length:            3.7  cm.  Normal appearance by transabdominal scan  Uterus  No  abnormality visualized.  Right Ovary  Not visualized.  Left Ovary  Size(cm)       3.5  x   2.52   x  1.73      Vol(ml): 7.99  Within normal limits.  Cul De Sac  No free fluid seen.  Adnexa  No abnormality visualized ---------------------------------------------------------------------- Impression  G3 P1011. Patient is here for fetal anatomy scan. On cell-free  fetal DNA screening, the risks of aneuploidies are not  increased.  Obstetric history is significant for a term vaginal delivery.  Her  pregnancy was complicated by gestational hypertension.  Patient takes low-dose aspirin  prophylaxis.  Her hemoglobin A1c was 5.7%.  Early glucose screening  ruled out gestational diabetes.  We performed fetal anatomy scan. No makers of  aneuploidies or fetal structural defects are seen. Fetal  biometry is consistent with her previously-established dates.  Amniotic fluid is normal and good fetal activity is seen.  Patient understands the limitations of ultrasound in detecting  fetal anomalies. ---------------------------------------------------------------------- Recommendations  -An appointment was made for her to return in 8 weeks for  fetal growth assessment.  -  Check PCI. ----------------------------------------------------------------------                  Fredia Fresh, MD Electronically Signed Final Report   10/25/2023 01:10 pm ----------------------------------------------------------------------    Assessment and Plan:  Pregnancy: G3P1011 at [redacted]w[redacted]d 1. History of gestational hypertension Stable BP, on ASA.  2. [redacted] weeks gestation of pregnancy 3. Supervision of other normal pregnancy, antepartum (Primary) Follow up anatomy scan scheduled. Preterm labor symptoms and general obstetric precautions including but not limited to vaginal bleeding, contractions, leaking of fluid and fetal movement were reviewed in detail with the patient. Please refer to After Visit Summary for other counseling recommendations.    Return in about 4 weeks (around 11/30/2023) for OFFICE OB VISIT (MD or APP).  Future Appointments  Date Time Provider Department Center  11/30/2023 10:35 AM Izell Harari, MD [facility]  12/20/2023 11:15 AM [facility] NURSE [facility]  12/20/2023 11:30 AM [facility] US2 [facility]  12/28/2023  8:30 AM [facility] LAB [facility]  12/28/2023  8:55 AM Amori Colomb, Gloris LABOR, MD [facility]    Gloris Hugger, MD

## 2023-11-03 NOTE — L&D Delivery Note (Signed)
 Faculty Note  In to room for intermittent decels. With pushing, patient found to be lip/rim, was able to reduce with pushing. Had patient start pushing with contractions, patient pushed effectively and delivered a viable female infant from OP position under epidural analgesia. Head delivered, no nuchal cord.  Shoulders and body delivered atraumatically. Infant vigorous from delivery. Nose and mouth bulb suctioned, infant delivered to mom's abdomen, cord clamped and cut after cord stopped pulsing.   Cord blood obtained. Placenta delivered spontaneously and intact, 3VC. Periclitoral laceration with arterial bleeding repaired with 3-0 vicryl after insertion of red rubber catheter into urethra with return of urine with hemostasis noted after repair. 1st degree laceration noted, repaired with 2-0 Vicryl in usual fashion. Manual sweep of uterus pulled out several small clots, uterus firm and empty after sweep. TXA given for additional hemostasis, one dose ancef given for manual sweep. Weight pending, Apgars pending. EBL: 350 mL.    Larence Pleas, MD, [facility] Attending Center for [employer] [facility])

## 2023-11-16 ENCOUNTER — Encounter: Payer: Self-pay | Admitting: Obstetrics & Gynecology

## 2023-11-30 ENCOUNTER — Ambulatory Visit (INDEPENDENT_AMBULATORY_CARE_PROVIDER_SITE_OTHER): Payer: Medicaid Other | Admitting: Obstetrics and Gynecology

## 2023-11-30 VITALS — BP 121/66 | HR 99 | Wt 185.6 lb

## 2023-11-30 DIAGNOSIS — Z3A24 24 weeks gestation of pregnancy: Secondary | ICD-10-CM

## 2023-11-30 DIAGNOSIS — O99810 Abnormal glucose complicating pregnancy: Secondary | ICD-10-CM

## 2023-11-30 NOTE — Progress Notes (Signed)
ROB: Movement questions

## 2023-11-30 NOTE — Progress Notes (Signed)
   PRENATAL VISIT NOTE  Subjective:  Tiffany Bowman is a 33 y.o. G3P1011 at [redacted]w[redacted]d being seen today for ongoing prenatal care.  She is currently monitored for the following issues for this low-risk pregnancy and has Generalized anxiety disorder with panic attacks; History of gestational hypertension; Supervision of high risk pregnancy; Obesity in pregnancy, antepartum; and Prediabetes in mother during pregnancy on their problem list.  Patient reports no complaints.  Contractions: Not present. Vag. Bleeding: None.  Movement: Present. Denies leaking of fluid.   The following portions of the patient's history were reviewed and updated as appropriate: allergies, current medications, past family history, past medical history, past social history, past surgical history and problem list.   Objective:   Vitals:   11/30/23 1035  BP: 121/66  Pulse: 99  Weight: 185 lb 9.6 oz (84.2 kg)    Fetal Status: Fetal Heart Rate (bpm): 152   Movement: Present     General:  Alert, oriented and cooperative. Patient is in no acute distress.  Skin: Skin is warm and dry. No rash noted.   Cardiovascular: Normal heart rate noted  Respiratory: Normal respiratory effort, no problems with respiration noted  Abdomen: Soft, gravid, appropriate for gestational age.  Pain/Pressure: Absent     Pelvic: Cervical exam deferred        Extremities: Normal range of motion.  Edema: None  Mental Status: Normal mood and affect. Normal behavior. Normal judgment and thought content.   Assessment and Plan:  Pregnancy: G3P1011 at [redacted]w[redacted]d 1. Pregnancy No issues. Watch weight, pt denies any n/v issues but changed to healthier diet after early GTT. Repeat GTT, 28wk labs next visit. Confirms on low dose ASA. F/u mfm surveillance growth scan  Preterm labor symptoms and general obstetric precautions including but not limited to vaginal bleeding, contractions, leaking of fluid and fetal movement were reviewed in detail with the  patient. Please refer to After Visit Summary for other counseling recommendations.   Return in about 3 weeks (around 12/21/2023) for low risk ob, in person, fasting 2hr GTT, md or app.  Future Appointments  Date Time Provider Department Center  12/20/2023 11:15 AM [facility] NURSE [facility]  12/20/2023 11:30 AM [facility] US2 [facility] [place] [facility]  12/28/2023  8:30 AM [facility] LAB [facility]  12/28/2023  8:55 AM Anyanwu, Jethro Bastos, MD [facility]    [place] Bing, MD

## 2023-12-03 ENCOUNTER — Encounter: Payer: Self-pay | Admitting: Obstetrics & Gynecology

## 2023-12-20 ENCOUNTER — Ambulatory Visit: Payer: Medicaid Other

## 2023-12-20 ENCOUNTER — Other Ambulatory Visit: Payer: Self-pay

## 2023-12-20 ENCOUNTER — Ambulatory Visit: Payer: Medicaid Other | Attending: Obstetrics and Gynecology | Admitting: *Deleted

## 2023-12-20 ENCOUNTER — Other Ambulatory Visit: Payer: Self-pay | Admitting: *Deleted

## 2023-12-20 VITALS — BP 127/65 | HR 99

## 2023-12-20 DIAGNOSIS — Z3A27 27 weeks gestation of pregnancy: Secondary | ICD-10-CM | POA: Diagnosis not present

## 2023-12-20 DIAGNOSIS — O99212 Obesity complicating pregnancy, second trimester: Secondary | ICD-10-CM

## 2023-12-20 DIAGNOSIS — Z3480 Encounter for supervision of other normal pregnancy, unspecified trimester: Secondary | ICD-10-CM

## 2023-12-20 DIAGNOSIS — O09292 Supervision of pregnancy with other poor reproductive or obstetric history, second trimester: Secondary | ICD-10-CM | POA: Insufficient documentation

## 2023-12-20 DIAGNOSIS — E669 Obesity, unspecified: Secondary | ICD-10-CM

## 2023-12-20 DIAGNOSIS — Z362 Encounter for other antenatal screening follow-up: Secondary | ICD-10-CM | POA: Insufficient documentation

## 2023-12-20 DIAGNOSIS — Z8759 Personal history of other complications of pregnancy, childbirth and the puerperium: Secondary | ICD-10-CM | POA: Diagnosis not present

## 2023-12-20 DIAGNOSIS — O99210 Obesity complicating pregnancy, unspecified trimester: Secondary | ICD-10-CM

## 2023-12-20 DIAGNOSIS — O99213 Obesity complicating pregnancy, third trimester: Secondary | ICD-10-CM

## 2023-12-27 ENCOUNTER — Inpatient Hospital Stay (HOSPITAL_COMMUNITY)
Admission: AD | Admit: 2023-12-27 | Discharge: 2023-12-27 | Disposition: A | Discharge status: Home / Self Care | Payer: Medicaid Other | Attending: Obstetrics and Gynecology | Admitting: Obstetrics and Gynecology

## 2023-12-27 DIAGNOSIS — J069 Acute upper respiratory infection, unspecified: Secondary | ICD-10-CM | POA: Insufficient documentation

## 2023-12-27 DIAGNOSIS — Z3A28 28 weeks gestation of pregnancy: Secondary | ICD-10-CM | POA: Diagnosis not present

## 2023-12-27 DIAGNOSIS — O98513 Other viral diseases complicating pregnancy, third trimester: Secondary | ICD-10-CM | POA: Diagnosis not present

## 2023-12-27 DIAGNOSIS — O368130 Decreased fetal movements, third trimester, not applicable or unspecified: Secondary | ICD-10-CM | POA: Insufficient documentation

## 2023-12-27 DIAGNOSIS — O99513 Diseases of the respiratory system complicating pregnancy, third trimester: Secondary | ICD-10-CM | POA: Insufficient documentation

## 2023-12-27 NOTE — MAU Provider Note (Signed)
 History     [id number]  Arrival date and time: 12/27/23 1424    Chief Complaint  Patient presents with   Decreased Fetal Movement     HPI Tiffany Bowman is a 33 y.o. at [redacted]w[redacted]d with PMHx notable for anxiety, who presents for decreased fetal movement.   Patient reports decreased fetal movement over the past day or so Normal pattern has been movement mostly at night but usually feels movements are bigger No leaking fluid or bleeding No contractions  Does endorse having a slight cough and congestion Daughter recently diagnosed with RSV pneumonia Overall feels a little tired but not super sick   A/Positive/-- (10/29 0941)  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   Overview:   Mild herniation L5     Vaginal Pap smear, abnormal     Past Surgical History:  Procedure Laterality Date   WISDOM TOOTH EXTRACTION      Family History  Problem Relation Age of Onset   Hypertension Mother    Glaucoma Father    Anxiety disorder Sister    Diabetes Maternal Grandmother    Cancer Paternal Grandmother        breast cancer   Colon cancer Neg Hx     Social History   Socioeconomic History   Marital status: Single    Spouse name: Not on file   Number of children: Not on file   Years of education: Not on file   Highest education level: Bachelor's degree (e.g., BA, AB, BS)  Occupational History   Not on file  Tobacco Use   Smoking status: Never   Smokeless tobacco: Never  Vaping Use   Vaping status: Never Used  Substance and Sexual Activity   Alcohol use: Not Currently    Alcohol/week: 2.0 standard drinks of alcohol    Types: 2 Glasses of wine per week    Drug use: Never   Sexual activity: Yes  Other Topics Concern   Not on file  Social History Narrative   Has 33 year old daughter (2024)   Married   Husband works with [employer]   Social Drivers of Corporate investment banker Strain: Low Risk  (08/22/2018)   Overall Financial Resource Strain (CARDIA)    Difficulty of Paying Living Expenses: Not hard at all  Food Insecurity: No Food Insecurity (08/22/2018)   Hunger Vital Sign    Worried About Running Out of Food in the Last Year: Never true    Ran Out of Food in the Last Year: Never true  Transportation Needs: No Transportation Needs (08/22/2018)   PRAPARE - Administrator, Civil Service (Medical): No    Lack of Transportation (Non-Medical): No  Physical Activity: Insufficiently Active (08/22/2018)   Exercise Vital Sign    Days of Exercise per Week: 2 days    Minutes of Exercise per Session: 60 min  Stress: No Stress Concern Present (08/22/2018)  [employer] of Occupational Health - Occupational Stress Questionnaire    Feeling of Stress : Only a little  Social Connections: Unknown (03/09/2019)   Social Connection and Isolation Panel [NHANES]    Frequency of Communication with Friends and Family: Not on file    Frequency of Social Gatherings with Friends and Family: Twice a week    Attends Religious Services: Never    Database administrator or Organizations: No    Attends Banker Meetings: Never    Marital Status: Never married  Intimate Partner Violence: Not At Risk (03/06/2019)   Humiliation, Afraid, Rape, and Kick questionnaire    Fear of Current or Ex-Partner: No    Emotionally Abused: No    Physically Abused: No    Sexually Abused: No    Allergies  Allergen Reactions   Azithromycin Itching   Prozac [Fluoxetine Hcl] Itching    tching   Penicillins Rash    No current facility-administered medications on file prior to encounter.   Current Outpatient Medications on File Prior to Encounter   Medication Sig Dispense Refill   Prenatal Multivit-Min-Fe-FA (PRE-NATAL PO) Take 1 capsule by mouth daily in the afternoon.       ROS Pertinent positives and negative per HPI, all others reviewed and negative  Physical Exam   BP (!) 123/57 (BP Location: Right Arm)   Pulse 94   Temp 98.3 F (36.8 C) (Oral)   Resp 16   LMP 06/10/2023   SpO2 100%   Patient Vitals for the past 24 hrs:  BP Temp Temp src Pulse Resp SpO2  12/27/23 1447 (!) 123/57 98.3 F (36.8 C) Oral 94 16 100 %    Physical Exam Vitals reviewed.  Constitutional:      General: She is not in acute distress.    Appearance: She is well-developed. She is not diaphoretic.  Eyes:     General: No scleral icterus. Pulmonary:     Effort: Pulmonary effort is normal. No respiratory distress.  Skin:    General: Skin is warm and dry.  Neurological:     Mental Status: She is alert.     Coordination: Coordination normal.      Cervical Exam    Bedside Ultrasound Not performed.  My interpretation: n/a  FHT Baseline: 140 bpm Variability: Good {> 6 bpm) Accelerations: Reactive Decelerations: Absent Uterine activity: None Cat: I  Labs No results found for this or any previous visit (from the past 24 hours).  Imaging No results found.  MAU Course  Procedures Lab Orders  No laboratory test(s) ordered today   No orders of the defined types were placed in this encounter.  Imaging Orders  No imaging studies ordered today    MDM Moderate (Level 3-4)  Assessment and Plan  #Decreased fetal movement #[redacted] weeks gestation of pregnancy Patient with numerous self reported movements and multiple also heard on monitor during interview. Tracing shows reactive NST and is reassuring. Discussed given her anterior placenta and only just getting into the third trimester that her fetal movement is likely to not be as noticeable, also discussed normal pattern of babies rocking to sleep during the day and being more  active at night. Finally discussed it may be positional, if baby is still in frank breech position as she was on last ultrasound then movements are likely to be smaller.   #Viral URI Daughter recently diagnosed with RSV pneumonia. Reports she feels like she has a mild cold. Reviewed I would give list  of safe meds in pregnancy and discussed return precautions.   #FWB FHT Cat I NST: Reactive   Dispo: discharged to home in stable condition    Venora Maples, MD/MPH 12/27/23 3:20 PM  Allergies as of 12/27/2023       Reactions   Azithromycin Itching   Prozac [fluoxetine Hcl] Itching   tching   Penicillins Rash        Medication List     TAKE these medications    aspirin EC 81 MG tablet Take 2 tablets (162 mg total) by mouth at bedtime. Start taking when you are [redacted] weeks pregnant for rest of pregnancy for prevention of preeclampsia   ondansetron 4 MG disintegrating tablet Commonly known as: ZOFRAN-ODT Take 1 tablet (4 mg total) by mouth every 6 (six) hours as needed for nausea.   PRE-NATAL PO Take 1 capsule by mouth daily in the afternoon.

## 2023-12-27 NOTE — Discharge Instructions (Signed)

## 2023-12-27 NOTE — MAU Note (Signed)
.  Tiffany Bowman is a 33 y.o. at [redacted]w[redacted]d here in MAU reporting: DFM since last night. She reports she has not felt the stronger movements as she usually does. Anterior placenta. Denies VB, LOF, or pain.   Next OB appointment tomorrow.   Onset of complaint: Last night Pain score: Denies pain.   Vitals:   12/27/23 1447  BP: (!) 123/57  Pulse: 94  Resp: 16  Temp: 98.3 F (36.8 C)  SpO2: 100%      FHT: 135 initial external Lab orders placed from triage: none

## 2023-12-28 ENCOUNTER — Other Ambulatory Visit: Payer: Medicaid Other

## 2023-12-28 ENCOUNTER — Inpatient Hospital Stay (HOSPITAL_COMMUNITY)
Admission: AD | Admit: 2023-12-28 | Discharge: 2023-12-28 | Disposition: A | Discharge status: Home / Self Care | Payer: Self-pay | Attending: Obstetrics and Gynecology | Admitting: Obstetrics and Gynecology

## 2023-12-28 ENCOUNTER — Encounter: Payer: Medicaid Other | Admitting: Obstetrics & Gynecology

## 2023-12-28 ENCOUNTER — Encounter (HOSPITAL_COMMUNITY): Payer: Self-pay | Admitting: Obstetrics and Gynecology

## 2023-12-28 DIAGNOSIS — Z1152 Encounter for screening for COVID-19: Secondary | ICD-10-CM | POA: Insufficient documentation

## 2023-12-28 DIAGNOSIS — Z3480 Encounter for supervision of other normal pregnancy, unspecified trimester: Secondary | ICD-10-CM

## 2023-12-28 DIAGNOSIS — J101 Influenza due to other identified influenza virus with other respiratory manifestations: Secondary | ICD-10-CM | POA: Diagnosis not present

## 2023-12-28 DIAGNOSIS — O99513 Diseases of the respiratory system complicating pregnancy, third trimester: Secondary | ICD-10-CM | POA: Diagnosis not present

## 2023-12-28 DIAGNOSIS — Z3A28 28 weeks gestation of pregnancy: Secondary | ICD-10-CM | POA: Insufficient documentation

## 2023-12-28 DIAGNOSIS — O98513 Other viral diseases complicating pregnancy, third trimester: Secondary | ICD-10-CM | POA: Diagnosis not present

## 2023-12-28 DIAGNOSIS — J069 Acute upper respiratory infection, unspecified: Secondary | ICD-10-CM

## 2023-12-28 DIAGNOSIS — O26893 Other specified pregnancy related conditions, third trimester: Secondary | ICD-10-CM

## 2023-12-28 DIAGNOSIS — B9789 Other viral agents as the cause of diseases classified elsewhere: Secondary | ICD-10-CM | POA: Insufficient documentation

## 2023-12-28 DIAGNOSIS — Z3689 Encounter for other specified antenatal screening: Secondary | ICD-10-CM | POA: Insufficient documentation

## 2023-12-28 DIAGNOSIS — O09293 Supervision of pregnancy with other poor reproductive or obstetric history, third trimester: Secondary | ICD-10-CM | POA: Diagnosis not present

## 2023-12-28 DIAGNOSIS — O99213 Obesity complicating pregnancy, third trimester: Secondary | ICD-10-CM | POA: Insufficient documentation

## 2023-12-28 DIAGNOSIS — O99210 Obesity complicating pregnancy, unspecified trimester: Secondary | ICD-10-CM

## 2023-12-28 LAB — URINALYSIS, ROUTINE W REFLEX MICROSCOPIC
Bilirubin Urine: NEGATIVE
Glucose, UA: NEGATIVE mg/dL
Hgb urine dipstick: NEGATIVE
Ketones, ur: 20 mg/dL — AB
Nitrite: NEGATIVE
Protein, ur: NEGATIVE mg/dL
Specific Gravity, Urine: 1.012 (ref 1.005–1.030)
pH: 8 (ref 5.0–8.0)

## 2023-12-28 LAB — RESP PANEL BY RT-PCR (RSV, FLU A&B, COVID)  RVPGX2
Influenza A by PCR: POSITIVE — AB
Influenza B by PCR: NEGATIVE
Resp Syncytial Virus by PCR: NEGATIVE
SARS Coronavirus 2 by RT PCR: NEGATIVE

## 2023-12-28 MED ORDER — ACETAMINOPHEN 500 MG PO TABS: 1000.0000 mg | ORAL_TABLET | Freq: Once | ORAL | Status: AC

## 2023-12-28 MED ORDER — VENTOLIN HFA 108 (90 BASE) MCG/ACT IN AERS: 1.0000 | INHALATION_SPRAY | Freq: Four times a day (QID) | RESPIRATORY_TRACT | 0 refills | Status: AC | PRN

## 2023-12-28 MED ORDER — ACETAMINOPHEN 500 MG PO TABS
1000.0000 mg | ORAL_TABLET | Freq: Once | ORAL | Status: AC
  Administered 2023-12-28: 1000 mg via ORAL
  Filled 2023-12-28: qty 2

## 2023-12-28 MED ORDER — IPRATROPIUM-ALBUTEROL 0.5-2.5 (3) MG/3ML IN SOLN
3.0000 mL | Freq: Once | RESPIRATORY_TRACT | Status: AC
  Administered 2023-12-28: 3 mL via RESPIRATORY_TRACT
  Filled 2023-12-28: qty 3

## 2023-12-28 MED ORDER — PROMETHAZINE-DM 6.25-15 MG/5ML PO SYRP: 5.0000 mL | ORAL_SOLUTION | Freq: Four times a day (QID) | ORAL | 0 refills | Status: DC | PRN

## 2023-12-28 NOTE — MAU Provider Note (Signed)
 History     CSN: [id number]  Arrival date and time: 12/28/23 9528   Event Date/Time   First Provider Initiated Contact with Patient 12/28/23 2013      Chief Complaint  Patient presents with   Cough   Fever   HPI Tiffany Bowman  33 y.o. [id number] [redacted]w[redacted]d here with complaints of cough and fever in the setting of 107 yo daughter recently dx with RSV and PNA. Patient reports malaise all day and was seen at [facility] for similar sx and they reported to her lungs were clear. Reports associated chest pain with breathing that she associates with couging.  ON Sunday 2/23 started having rhinorrhea and some mild congestion that continued through Monday but then today 12/28/23 noted onset of worse sx and fever.   Cough: started today. Reports a mildly productive cough with clear mucous but has to cough deeply to get anything up. Was seen at [facility] for this cough as well. Cough is effecting sleep. H/o reactive airways as a child  Fever: T max 101.3 today and responded to medication.   +FM, denies LOF, VB, contractions, vaginal discharge.   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   Overview:   Mild herniation L5     Vaginal Pap smear, abnormal     Past Surgical History:  Procedure Laterality Date   WISDOM TOOTH EXTRACTION      Family History  Problem Relation Age of Onset   Hypertension Mother    Glaucoma Father    Anxiety disorder Sister    Diabetes Maternal Grandmother    Cancer Paternal Grandmother        breast cancer   Colon cancer Neg Hx     Social History   Tobacco Use   Smoking status: Never   Smokeless tobacco: Never  Vaping Use   Vaping status: Never Used  Substance Use  Topics   Alcohol use: Not Currently    Alcohol/week: 2.0 standard drinks of alcohol    Types: 2 Glasses of wine per week   Drug use: Never    Allergies:  Allergies  Allergen Reactions   Azithromycin Itching   Prozac [Fluoxetine Hcl] Itching    tching   Penicillins Rash    Medications Prior to Admission  Medication Sig Dispense Refill Last Dose/Taking   aspirin EC 81 MG tablet Take 2 tablets (162 mg total) by mouth at bedtime. Start taking when you are [redacted] weeks pregnant for rest of pregnancy for prevention of preeclampsia 300 tablet 2 12/27/2023   Prenatal Multivit-Min-Fe-FA (PRE-NATAL PO) Take 1 capsule by mouth daily in the afternoon.   12/27/2023   ondansetron (ZOFRAN-ODT) 4 MG disintegrating tablet Take 1 tablet (4 mg total) by mouth every 6 (six) hours as needed for nausea. (Patient not taking: Reported on 12/20/2023) 20 tablet 0     Review of Systems  Constitutional:  Positive for chills and fever. Negative for activity change and appetite change.  HENT:  Positive for congestion and rhinorrhea.   Respiratory:  Positive for cough, chest tightness and shortness of breath.   Cardiovascular:  Positive for chest pain.  Gastrointestinal:  Negative for abdominal pain.  Genitourinary:  Negative for dysuria and vaginal bleeding.  Musculoskeletal:  Positive for arthralgias and myalgias.  Neurological:  Negative for headaches.  Hematological:  Negative for adenopathy.   Physical Exam   Blood pressure 126/71, pulse (!) 116, temperature 100 F (37.8 C), resp. rate 18, height 5\' 5"  (1.651 m), weight 83.5 kg, last menstrual period 06/10/2023, SpO2 100%.  Physical Exam Vitals and nursing note reviewed.  Constitutional:      General: She is not in acute distress.    Appearance: She is well-developed.     Comments: Pregnant female  HENT:     Head: Normocephalic and atraumatic.  Eyes:     General: No scleral icterus.    Conjunctiva/sclera: Conjunctivae normal.  Cardiovascular:      Rate and Rhythm: Normal rate.  Pulmonary:     Effort: Pulmonary effort is normal.  Chest:     Chest wall: No tenderness.  Abdominal:     Palpations: Abdomen is soft.     Tenderness: There is no abdominal tenderness. There is no guarding or rebound.     Comments: Gravid  Genitourinary:    Vagina: Normal.  Musculoskeletal:        General: Normal range of motion.     Cervical back: Normal range of motion and neck supple.  Skin:    General: Skin is warm and dry.     Findings: No rash.  Neurological:     Mental Status: She is alert and oriented to person, place, and time.     MAU Course  Procedures I reviewed the patient's fetal monitoring.  Baseline HR: 155 Variability:  moderate Accels:present Decels: none  A/P: Reactive NST  Reassured regarding fetal status.   MDM- moderate - RSV/Covid/flu testing - Duoneb treatment  Results for orders placed or performed during the hospital encounter of 12/28/23 (from the past 24 hours)  Resp panel by RT-PCR (RSV, Flu A&B, Covid) Anterior Nasal Swab     Status: Abnormal   Collection Time: 12/28/23  8:01 PM   Specimen: Anterior Nasal Swab  Result Value Ref Range   SARS Coronavirus 2 by RT PCR NEGATIVE NEGATIVE   Influenza A by PCR POSITIVE (A) NEGATIVE   Influenza B by PCR NEGATIVE NEGATIVE   Resp Syncytial Virus by PCR NEGATIVE NEGATIVE  Urinalysis, Routine w reflex microscopic -Urine, Clean Catch     Status: Abnormal   Collection Time: 12/28/23  8:03 PM  Result Value Ref Range   Color, Urine YELLOW YELLOW   APPearance HAZY (A) CLEAR   Specific Gravity, Urine 1.012 1.005 - 1.030   pH 8.0 5.0 - 8.0   Glucose, UA NEGATIVE NEGATIVE mg/dL   Hgb urine dipstick NEGATIVE NEGATIVE   Bilirubin Urine NEGATIVE NEGATIVE   Ketones, ur 20 (A) NEGATIVE mg/dL   Protein, ur NEGATIVE NEGATIVE mg/dL   Nitrite NEGATIVE NEGATIVE   Leukocytes,Ua LARGE (A) NEGATIVE   RBC / HPF 0-5 0 - 5 RBC/hpf   WBC, UA 11-20 0 - 5 WBC/hpf   Bacteria, UA FEW (A)  NONE SEEN   Squamous Epithelial / HPF 21-50 0 - 5 /HPF   Mucus PRESENT    Non Squamous Epithelial 0-5 (A) NONE SEEN     9:49 PM Reassessment-- improved air movement. Flu A positive results reviewed with  patient. Will send home. Plan to give tylenol prior to discharge.   Assessment and Plan   1. Supervision of high risk pregnancy   2. Obesity in pregnancy, antepartum   3. Viral URI with cough   4. NST (non-stress test) reactive   5. [redacted] weeks gestation of pregnancy    - Discharge home with OTC supports - Letter for work provided - Reviewed supportive care at home  [facility] 12/28/2023, 8:39 PM

## 2023-12-28 NOTE — MAU Note (Signed)
.  Tiffany Bowman is a 33 y.o. at [redacted]w[redacted]d here in MAU reporting feeling bad all day. Daughter just got over RSV and pneumonia. WEnt to Urgent Care and they listened to her lungs and said they were clear. Pt dopplered FHTS at home and they were in 180s so she came in. Has bad cough. Chest hurts when she breaths which she thinks may be due to coughing. Reports good FM and denies LOF or VB.   LMP: n/a Onset of complaint: this am Pain score: 6 Vitals:   12/28/23 1953  BP: 126/71  Pulse: (!) 116  Resp: 18  Temp: 100 F (37.8 C)  SpO2: 100%     FHT: 166  Lab orders placed from triage: u/a and Respir panel

## 2023-12-28 NOTE — Discharge Instructions (Signed)
 You were seen for viral upper respiratory symptoms. You were found to have influenza A. You will need to be out of work until you are > 24 hours without a fever and not taking tylenol.   To support your symptoms - Rest - Fluids - Nutrition at you can-- soups are a great option - You can use Tylenol 1000mg  every 6 hours for muscle aches and fever  Please return if your fevers are not controlled, you have trouble breathing, you are not urinating normally, or any other concern

## 2023-12-28 NOTE — MAU Note (Signed)
 RN called lab to inquire about respiratory panel. Lab tech, Duwayne Heck, answered and informed RN that that they have received sample and will process now.Marland Kitchen

## 2023-12-28 NOTE — MAU Note (Signed)
 RT at bedside.

## 2024-01-04 ENCOUNTER — Encounter: Payer: Self-pay | Admitting: Obstetrics & Gynecology

## 2024-01-11 ENCOUNTER — Ambulatory Visit (INDEPENDENT_AMBULATORY_CARE_PROVIDER_SITE_OTHER): Payer: Medicaid Other | Admitting: Obstetrics and Gynecology

## 2024-01-11 ENCOUNTER — Other Ambulatory Visit: Payer: Medicaid Other

## 2024-01-11 VITALS — BP 122/64 | Wt 184.0 lb

## 2024-01-11 DIAGNOSIS — Z3A30 30 weeks gestation of pregnancy: Secondary | ICD-10-CM

## 2024-01-11 DIAGNOSIS — Z3480 Encounter for supervision of other normal pregnancy, unspecified trimester: Secondary | ICD-10-CM

## 2024-01-11 DIAGNOSIS — Z8759 Personal history of other complications of pregnancy, childbirth and the puerperium: Secondary | ICD-10-CM

## 2024-01-11 DIAGNOSIS — O99343 Other mental disorders complicating pregnancy, third trimester: Secondary | ICD-10-CM | POA: Diagnosis not present

## 2024-01-11 DIAGNOSIS — O99810 Abnormal glucose complicating pregnancy: Secondary | ICD-10-CM

## 2024-01-11 DIAGNOSIS — Z23 Encounter for immunization: Secondary | ICD-10-CM | POA: Diagnosis not present

## 2024-01-11 DIAGNOSIS — F410 Panic disorder [episodic paroxysmal anxiety] without agoraphobia: Secondary | ICD-10-CM

## 2024-01-11 NOTE — Progress Notes (Addendum)
   PRENATAL VISIT NOTE  Subjective:  Tiffany Bowman is a 33 y.o. G3P1011 at [redacted]w[redacted]d being seen today for ongoing prenatal care.  She is currently monitored for the following issues for this high-risk pregnancy and has Generalized anxiety disorder with panic attacks; History of gestational hypertension; Supervision of high risk pregnancy; Obesity in pregnancy, antepartum; and Prediabetes in mother during pregnancy on their problem list.  Patient reports  fatigue, needs nap in afternoons .  Contractions: Not present. Vag. Bleeding: None.  Movement: Present. Denies leaking of fluid.   The following portions of the patient's history were reviewed and updated as appropriate: allergies, current medications, past family history, past medical history, past social history, past surgical history and problem list.   Objective:   Vitals:   01/11/24 0855  BP: 122/64  Weight: 184 lb (83.5 kg)    Fetal Status: Fetal Heart Rate (bpm): 143   Movement: Present     General:  Alert, oriented and cooperative. Patient is in no acute distress.  Skin: Skin is warm and dry. No rash noted.   Cardiovascular: Normal heart rate noted  Respiratory: Normal respiratory effort, no problems with respiration noted  Abdomen: Soft, gravid, appropriate for gestational age.  Pain/Pressure: Absent      Assessment and Plan:  Pregnancy: G3P1011 at [redacted]w[redacted]d 1. Supervision of high risk pregnancy (Primary) 2. [redacted] weeks gestation of pregnancy 2h, CBC, HIV, RPR today Tdap today Recently recovered from the flu Discussed management of breech presentation if persistent at 36 weeks Growth [place] scheduled 3/24  3. Prediabetes in mother during pregnancy Normal early 2h, routine 2h today  4. History of gestational hypertension ldASA Normotensive Normal baseline serum labs, "baseline" P/C ratio today  5. Generalized anxiety disorder with panic attacks Reports mood is overall fine, sometimes more irritable but doing OK from anxiety  standpoint  Please refer to After Visit Summary for other counseling recommendations.   Return in about 2 weeks (around 01/25/2024) for return OB at 30 weeks.  Future Appointments  Date Time Provider Department Center  01/24/2024  9:30 AM [facility]  01/25/2024  8:55 AM Constant, Gigi Gin, MD [facility]  02/08/2024  8:55 AM Anyanwu, Jethro Bastos, MD [facility]  02/22/2024  9:35 AM Anyanwu, Jethro Bastos, MD [facility]  02/29/2024  8:55 AM Anyanwu, Jethro Bastos, MD [facility]  03/07/2024  9:35 AM Anyanwu, Jethro Bastos, MD [facility]  03/14/2024  8:55 AM [place] Bing, MD [facility]   Lennart Pall, MD

## 2024-01-11 NOTE — Progress Notes (Signed)
 ROB: Was sick with flu, went to MAU, doing much better

## 2024-01-12 ENCOUNTER — Encounter: Payer: Self-pay | Admitting: Obstetrics & Gynecology

## 2024-01-12 LAB — CBC
Hematocrit: 38 % (ref 34.0–46.6)
Hemoglobin: 12.8 g/dL (ref 11.1–15.9)
MCH: 31.8 pg (ref 26.6–33.0)
MCHC: 33.7 g/dL (ref 31.5–35.7)
MCV: 94 fL (ref 79–97)
Platelets: 242 10*3/uL (ref 150–450)
RBC: 4.03 x10E6/uL (ref 3.77–5.28)
RDW: 12.8 % (ref 11.7–15.4)
WBC: 8.1 10*3/uL (ref 3.4–10.8)

## 2024-01-12 LAB — HIV ANTIBODY (ROUTINE TESTING W REFLEX): HIV Screen 4th Generation wRfx: NONREACTIVE

## 2024-01-12 LAB — GLUCOSE TOLERANCE, 2 HOURS W/ 1HR
Glucose, 1 hour: 169 mg/dL (ref 70–179)
Glucose, 2 hour: 92 mg/dL (ref 70–152)
Glucose, Fasting: 79 mg/dL (ref 70–91)

## 2024-01-12 LAB — RPR: RPR Ser Ql: NONREACTIVE

## 2024-01-13 ENCOUNTER — Encounter: Payer: Self-pay | Admitting: Obstetrics and Gynecology

## 2024-01-13 LAB — PROTEIN / CREATININE RATIO, URINE
Creatinine, Urine: 104.2 mg/dL
Protein, Ur: 22.5 mg/dL
Protein/Creat Ratio: 216 mg/g{creat} — ABNORMAL HIGH (ref 0–200)

## 2024-01-17 ENCOUNTER — Inpatient Hospital Stay (HOSPITAL_COMMUNITY)
Admission: AD | Admit: 2024-01-17 | Discharge: 2024-01-17 | Disposition: A | Discharge status: Home / Self Care | Attending: Obstetrics and Gynecology | Admitting: Obstetrics and Gynecology

## 2024-01-17 ENCOUNTER — Encounter (HOSPITAL_COMMUNITY): Payer: Self-pay | Admitting: Obstetrics and Gynecology

## 2024-01-17 DIAGNOSIS — O26893 Other specified pregnancy related conditions, third trimester: Secondary | ICD-10-CM | POA: Diagnosis not present

## 2024-01-17 DIAGNOSIS — N898 Other specified noninflammatory disorders of vagina: Secondary | ICD-10-CM

## 2024-01-17 DIAGNOSIS — O99210 Obesity complicating pregnancy, unspecified trimester: Secondary | ICD-10-CM

## 2024-01-17 DIAGNOSIS — Z0371 Encounter for suspected problem with amniotic cavity and membrane ruled out: Secondary | ICD-10-CM | POA: Insufficient documentation

## 2024-01-17 DIAGNOSIS — Z3A31 31 weeks gestation of pregnancy: Secondary | ICD-10-CM | POA: Insufficient documentation

## 2024-01-17 DIAGNOSIS — Z3480 Encounter for supervision of other normal pregnancy, unspecified trimester: Secondary | ICD-10-CM

## 2024-01-17 LAB — URINALYSIS, ROUTINE W REFLEX MICROSCOPIC
Bilirubin Urine: NEGATIVE
Glucose, UA: NEGATIVE mg/dL
Hgb urine dipstick: NEGATIVE
Ketones, ur: NEGATIVE mg/dL
Nitrite: NEGATIVE
Protein, ur: NEGATIVE mg/dL
Specific Gravity, Urine: 1.004 — ABNORMAL LOW (ref 1.005–1.030)
pH: 7 (ref 5.0–8.0)

## 2024-01-17 LAB — RUPTURE OF MEMBRANE (ROM)PLUS: Rom Plus: NEGATIVE

## 2024-01-17 NOTE — MAU Provider Note (Signed)
 Chief Complaint:  Rupture of Membranes   Event Date/Time   First Provider Initiated Contact with Patient 01/17/24 2042     HPI: Tiffany Bowman is a 33 y.o. G3P1011 at 71w4dwho presents to maternity admissions reporting wetness to her clothing tonight.  No big loss of fluid, but clothing more wet than before.  Intermittent mild contractions. . She reports good fetal movement, denies vaginal bleeding, vaginal itching/burning, urinary symptoms, or fever/chills.    Vaginal Discharge The patient's primary symptoms include vaginal discharge. The patient's pertinent negatives include no genital itching, genital odor, pelvic pain or vaginal bleeding. The current episode started today. She is pregnant. Pertinent negatives include no back pain, chills or dysuria. The vaginal discharge was watery. There has been no bleeding. She has not been passing clots. She has not been passing tissue.   RN Note:  Pt says - started Thursday- had fluid like D/C  ( Also had with last baby - full term ) But tonight at 7pm- noticed underwear very wet. Marland Kitchen Has had some cramps.  Called [facility] today- answered - but told to call back.  Feels baby moving . Has doppler at home-       Past Medical History: Past Medical History:  Diagnosis Date   Anxiety    History of gestational hypertension 04/07/2019   HPV (human papilloma virus) infection    Lumbar herniated disc 06/01/2016   Overview:   Mild herniation L5     Vaginal Pap smear, abnormal     Past obstetric history: OB History  Gravida Para Term Preterm AB Living  3 1 1  0 1 1  SAB IAB Ectopic Multiple Live Births  0 1 0 0 1    # Outcome Date GA Lbr Len/2nd Weight Sex Type Anes PTL Lv  3 Current           2 Term 03/10/19 [redacted]w[redacted]d 03:05 / 00:18 4005 g F Vag-Spont EPI  LIV     Birth Comments: grunting and retractions in first hour of birth  1 IAB             Past Surgical History: Past Surgical History:  Procedure Laterality Date   WISDOM TOOTH  EXTRACTION      Family History: Family History  Problem Relation Age of Onset   Hypertension Mother    Glaucoma Father    Anxiety disorder Sister    Diabetes Maternal Grandmother    Cancer Paternal Grandmother        breast cancer   Colon cancer Neg Hx     Social History: Social History   Tobacco Use   Smoking status: Never   Smokeless tobacco: Never  Vaping Use   Vaping status: Never Used  Substance Use Topics   Alcohol use: Not Currently    Alcohol/week: 2.0 standard drinks of alcohol    Types: 2 Glasses of wine per week   Drug use: Never    Allergies:  Allergies  Allergen Reactions   Azithromycin Itching   Prozac [Fluoxetine Hcl] Itching    tching   Penicillins Rash    Meds:  Medications Prior to Admission  Medication Sig Dispense Refill Last Dose/Taking   aspirin EC 81 MG tablet Take 2 tablets (162 mg total) by mouth at bedtime. Start taking when you are [redacted] weeks pregnant for rest of pregnancy for prevention of preeclampsia 300 tablet 2 01/16/2024   Prenatal Multivit-Min-Fe-FA (PRE-NATAL PO) Take 1 capsule by mouth daily in the afternoon.   01/17/2024  albuterol (VENTOLIN HFA) 108 (90 Base) MCG/ACT inhaler Inhale 1-2 puffs into the lungs every 6 (six) hours as needed for wheezing or shortness of breath. 18 g 0    ondansetron (ZOFRAN-ODT) 4 MG disintegrating tablet Take 1 tablet (4 mg total) by mouth every 6 (six) hours as needed for nausea. 20 tablet 0    promethazine-dextromethorphan (PROMETHAZINE-DM) 6.25-15 MG/5ML syrup Take 5 mLs by mouth 4 (four) times daily as needed for cough. (Patient not taking: Reported on 01/11/2024) 118 mL 0     I have reviewed patient's Past Medical Hx, Surgical Hx, Family Hx, Social Hx, medications and allergies.   ROS:  Review of Systems  Constitutional:  Negative for chills.  Genitourinary:  Positive for vaginal discharge. Negative for dysuria and pelvic pain.  Musculoskeletal:  Negative for back pain.   Other systems  negative  Physical Exam  Patient Vitals for the past 24 hrs:  BP Temp Temp src Pulse Resp Height Weight  01/17/24 2006 129/70 98 F (36.7 C) Oral 88 14 5\' 5"  (1.651 m) 83.8 kg   Constitutional: Well-developed, well-nourished female in no acute distress.  Cardiovascular: normal rate  Respiratory: normal effort GI: Abd soft, non-tender, gravid appropriate for gestational age.   No rebound or guarding. MS: Extremities nontender, no edema, normal ROM Neurologic: Alert and oriented x 4.   PELVIC EXAM: ROM plus sent   FHT:  Baseline 135 , moderate variability, accelerations present, no decelerations Contractions: Irregular     Labs: Results for orders placed or performed during the hospital encounter of 01/17/24 (from the past 24 hours)  Rupture of Membrane (ROM) Plus     Status: None   Collection Time: 01/17/24  8:16 PM  Result Value Ref Range   Rom Plus NEGATIVE   Urinalysis, Routine w reflex microscopic -Urine, Clean Catch     Status: Abnormal   Collection Time: 01/17/24  9:00 PM  Result Value Ref Range   Color, Urine STRAW (A) YELLOW   APPearance CLEAR CLEAR   Specific Gravity, Urine 1.004 (L) 1.005 - 1.030   pH 7.0 5.0 - 8.0   Glucose, UA NEGATIVE NEGATIVE mg/dL   Hgb urine dipstick NEGATIVE NEGATIVE   Bilirubin Urine NEGATIVE NEGATIVE   Ketones, ur NEGATIVE NEGATIVE mg/dL   Protein, ur NEGATIVE NEGATIVE mg/dL   Nitrite NEGATIVE NEGATIVE   Leukocytes,Ua TRACE (A) NEGATIVE   RBC / HPF 0-5 0 - 5 RBC/hpf   WBC, UA 0-5 0 - 5 WBC/hpf   Bacteria, UA RARE (A) NONE SEEN   Squamous Epithelial / HPF 0-5 0 - 5 /HPF    A/Positive/-- (10/29 4401)  Imaging:    MAU Course/MDM: I have reviewed the triage vital signs and the nursing notes.   Pertinent labs & imaging results that were available during my care of the patient were reviewed by me and considered in my medical decision making (see chart for details).      I have reviewed her medical records including past results, notes  and treatments.   I have ordered labs and reviewed results. ROM+ is negative NST reviewed .  Treatments in MAU included EFM.    Assessment: Single IUP at [redacted]w[redacted]d Vaginal discharge  Negative test for ruptured membranes  Plan: Discharge home Labor precautions and fetal kick counts Follow up in Office for prenatal visits and recheck Encouraged to return if she develops worsening of symptoms, increase in pain, fever, or other concerning symptoms.   Pt stable at time of discharge.  Hilda Lias  Maris Berger, MSN Certified Nurse-Midwife 01/17/2024 8:42 PM

## 2024-01-17 NOTE — MAU Note (Signed)
 Pt says - started Thursday- had fluid like D/C  ( Also had with last baby - full term ) But tonight at 7pm- noticed underwear very wet. Marland Kitchen Has had some cramps.  Called [facility] today- answered - but told to call back.  Feels baby moving . Has doppler at home-

## 2024-01-18 ENCOUNTER — Encounter: Payer: Self-pay | Admitting: Obstetrics & Gynecology

## 2024-01-19 LAB — CULTURE, OB URINE

## 2024-01-24 ENCOUNTER — Ambulatory Visit: Payer: Medicaid Other | Attending: Maternal & Fetal Medicine

## 2024-01-24 DIAGNOSIS — O99213 Obesity complicating pregnancy, third trimester: Secondary | ICD-10-CM | POA: Diagnosis present

## 2024-01-24 DIAGNOSIS — E669 Obesity, unspecified: Secondary | ICD-10-CM | POA: Diagnosis not present

## 2024-01-24 DIAGNOSIS — Z3A32 32 weeks gestation of pregnancy: Secondary | ICD-10-CM

## 2024-01-25 ENCOUNTER — Encounter: Payer: Self-pay | Admitting: Obstetrics and Gynecology

## 2024-01-25 ENCOUNTER — Ambulatory Visit: Admitting: Obstetrics and Gynecology

## 2024-01-25 VITALS — BP 114/70 | HR 86 | Wt 183.4 lb

## 2024-01-25 DIAGNOSIS — O99210 Obesity complicating pregnancy, unspecified trimester: Secondary | ICD-10-CM

## 2024-01-25 DIAGNOSIS — Z3A32 32 weeks gestation of pregnancy: Secondary | ICD-10-CM

## 2024-01-25 DIAGNOSIS — Z8759 Personal history of other complications of pregnancy, childbirth and the puerperium: Secondary | ICD-10-CM

## 2024-01-25 DIAGNOSIS — Z3480 Encounter for supervision of other normal pregnancy, unspecified trimester: Secondary | ICD-10-CM

## 2024-01-25 NOTE — Progress Notes (Addendum)
   PRENATAL VISIT NOTE  Subjective:  Tiffany Bowman is a 33 y.o. G3P1011 at [redacted]w[redacted]d being seen today for ongoing prenatal care.  She is currently monitored for the following issues for this low-risk pregnancy and has Generalized anxiety disorder with panic attacks; History of gestational hypertension; Supervision of high risk pregnancy; Obesity in pregnancy, antepartum; and Prediabetes in mother during pregnancy on their problem list.  Patient reports no complaints.  Contractions: Not present. Vag. Bleeding: None.  Movement: Present. Denies leaking of fluid.   The following portions of the patient's history were reviewed and updated as appropriate: allergies, current medications, past family history, past medical history, past social history, past surgical history and problem list.   Objective:   Vitals:   01/25/24 0857  BP: 114/70  Pulse: 86  Weight: 183 lb 6.4 oz (83.2 kg)    Fetal Status: Fetal Heart Rate (bpm): 149 Fundal Height: 32 cm Movement: Present     General:  Alert, oriented and cooperative. Patient is in no acute distress.  Skin: Skin is warm and dry. No rash noted.   Cardiovascular: Normal heart rate noted  Respiratory: Normal respiratory effort, no problems with respiration noted  Abdomen: Soft, gravid, appropriate for gestational age.  Pain/Pressure: Absent     Pelvic: Cervical exam deferred        Extremities: Normal range of motion.  Edema: None  Mental Status: Normal mood and affect. Normal behavior. Normal judgment and thought content.   Assessment and Plan:  Pregnancy: G3P1011 at [redacted]w[redacted]d 1. Supervision of high risk pregnancy (Primary) Patient is doing well without complaints Patient plans Paraguard for contraception Reviewed 3/24 growth scan- no further ultrasound needed unless clinically indicated  2. Obesity in pregnancy, antepartum Continue ASA  3. History of gestational hypertension Normotensive and asymptomatic  Preterm labor symptoms and general  obstetric precautions including but not limited to vaginal bleeding, contractions, leaking of fluid and fetal movement were reviewed in detail with the patient. Please refer to After Visit Summary for other counseling recommendations.   Return in about 2 weeks (around 02/08/2024) for in person, ROB, Low risk.  Future Appointments  Date Time Provider Department Center  02/08/2024  8:55 AM Anyanwu, Jethro Bastos, MD [facility]  02/22/2024  9:35 AM Anyanwu, Jethro Bastos, MD [facility]  02/29/2024  8:55 AM Macon Large, Jethro Bastos, MD [facility]  03/07/2024  9:35 AM Macon Large, Jethro Bastos, MD [facility]  03/14/2024  8:55 AM Concord Bing, MD [facility]    Catalina Antigua, MD

## 2024-01-25 NOTE — Progress Notes (Signed)
 ROB: Urine at MAU- abnormal 01/17/24 curious about what to do

## 2024-01-31 ENCOUNTER — Encounter: Payer: Self-pay | Admitting: *Deleted

## 2024-02-02 ENCOUNTER — Encounter: Payer: Self-pay | Admitting: *Deleted

## 2024-02-08 ENCOUNTER — Encounter: Payer: Self-pay | Admitting: Obstetrics & Gynecology

## 2024-02-08 ENCOUNTER — Ambulatory Visit: Admitting: Obstetrics & Gynecology

## 2024-02-08 VITALS — BP 114/78 | HR 83 | Wt 183.6 lb

## 2024-02-08 DIAGNOSIS — Z3480 Encounter for supervision of other normal pregnancy, unspecified trimester: Secondary | ICD-10-CM

## 2024-02-08 DIAGNOSIS — O368130 Decreased fetal movements, third trimester, not applicable or unspecified: Secondary | ICD-10-CM

## 2024-02-08 DIAGNOSIS — Z3A34 34 weeks gestation of pregnancy: Secondary | ICD-10-CM

## 2024-02-08 NOTE — Progress Notes (Signed)
 ROB

## 2024-02-08 NOTE — Progress Notes (Signed)
   PRENATAL VISIT NOTE  Subjective:  Tiffany Bowman is a 33 y.o. G3P1011 at [redacted]w[redacted]d being seen today for ongoing prenatal care.  She is currently monitored for the following issues for this high-risk pregnancy and has Generalized anxiety disorder with panic attacks; History of gestational hypertension; Supervision of high risk pregnancy; Obesity in pregnancy, antepartum; and Prediabetes in mother during pregnancy on their problem list.  Patient reports  decreased fetal movement since last night.  Had this concern recently too, had negative evaluation .  Contractions: Not present. Vag. Bleeding: None.  Movement: (!) Decreased. Denies leaking of fluid.   The following portions of the patient's history were reviewed and updated as appropriate: allergies, current medications, past family history, past medical history, past social history, past surgical history and problem list.   Objective:   Vitals:   02/08/24 0903  BP: 114/78  Pulse: 83  Weight: 183 lb 9.6 oz (83.3 kg)    Fetal Status: Fetal Heart Rate (bpm): 149   Movement: (!) Decreased     General:  Alert, oriented and cooperative. Patient is in no acute distress.  Skin: Skin is warm and dry. No rash noted.   Cardiovascular: Normal heart rate noted  Respiratory: Normal respiratory effort, no problems with respiration noted  Abdomen: Soft, gravid, appropriate for gestational age.  Pain/Pressure: Present     Pelvic: Cervical exam deferred        Extremities: Normal range of motion.  Edema: None  Mental Status: Normal mood and affect. Normal behavior. Normal judgment and thought content.   Assessment and Plan:  Pregnancy: G3P1011 at [redacted]w[redacted]d 1. Decreased fetal movements in third trimester (Primary) - Fetal nonstress test  performed today was reviewed and was found to be reactive.  Patient reported good fetal movement during NST.  Offered patient weekly NSTs given her complaint of decreased fetal movement, she agreed. Will continue to  monitor closely. If persists, may add on BPPs for reassurance also discussed possible earlier IOL (~39 weeks or earlier if indicated). Fetal kick counts reviewed, FM precautions reviewed. Continue close observation.   2. [redacted] weeks gestation of pregnancy 3. Supervision of high risk pregnancy No other concerns. Preterm labor symptoms and general obstetric precautions including but not limited to vaginal bleeding, contractions, leaking of fluid and fetal movement were reviewed in detail with the patient. Please refer to After Visit Summary for other counseling recommendations.   Return in about 1 week (around 02/15/2024) for NST only and will add weekly NST to other scheduled visits.  Future Appointments  Date Time Provider Department Center  02/15/2024  9:15 AM [facility] NST [facility]  02/22/2024  9:35 AM Alsie Younes, Jethro Bastos, MD [facility]  02/22/2024  9:35 AM [facility] NST [facility]  02/29/2024  8:15 AM [facility] NST [facility]  02/29/2024  8:55 AM Shaleah Nissley, Jethro Bastos, MD [facility]  03/07/2024  9:35 AM Macon Large, Jethro Bastos, MD [facility]  03/07/2024 10:15 AM [facility] NST [facility]  03/14/2024  8:15 AM [facility] NST [facility]  03/14/2024  8:55 AM  Bing, MD [facility]    Jaynie Collins, MD

## 2024-02-08 NOTE — Patient Instructions (Signed)

## 2024-02-15 ENCOUNTER — Ambulatory Visit: Admitting: *Deleted

## 2024-02-15 VITALS — BP 115/81 | HR 91

## 2024-02-15 DIAGNOSIS — Z3480 Encounter for supervision of other normal pregnancy, unspecified trimester: Secondary | ICD-10-CM

## 2024-02-15 DIAGNOSIS — O368131 Decreased fetal movements, third trimester, fetus 1: Secondary | ICD-10-CM | POA: Diagnosis not present

## 2024-02-15 DIAGNOSIS — Z3A35 35 weeks gestation of pregnancy: Secondary | ICD-10-CM | POA: Diagnosis not present

## 2024-02-15 NOTE — Progress Notes (Signed)
 Pt here for NST today.   Reactive and reassuring

## 2024-02-22 ENCOUNTER — Other Ambulatory Visit (HOSPITAL_COMMUNITY)
Admission: RE | Admit: 2024-02-22 | Discharge: 2024-02-22 | Disposition: A | Discharge status: Home / Self Care | Source: Ambulatory Visit | Attending: Obstetrics & Gynecology | Admitting: Obstetrics & Gynecology

## 2024-02-22 ENCOUNTER — Ambulatory Visit: Admitting: *Deleted

## 2024-02-22 ENCOUNTER — Encounter: Payer: Self-pay | Admitting: Obstetrics & Gynecology

## 2024-02-22 ENCOUNTER — Ambulatory Visit: Admitting: Obstetrics & Gynecology

## 2024-02-22 VITALS — BP 112/77 | HR 87

## 2024-02-22 DIAGNOSIS — Z3A36 36 weeks gestation of pregnancy: Secondary | ICD-10-CM

## 2024-02-22 DIAGNOSIS — Z3483 Encounter for supervision of other normal pregnancy, third trimester: Secondary | ICD-10-CM | POA: Diagnosis present

## 2024-02-22 DIAGNOSIS — Z3480 Encounter for supervision of other normal pregnancy, unspecified trimester: Secondary | ICD-10-CM

## 2024-02-22 DIAGNOSIS — O368130 Decreased fetal movements, third trimester, not applicable or unspecified: Secondary | ICD-10-CM | POA: Diagnosis not present

## 2024-02-22 DIAGNOSIS — O368131 Decreased fetal movements, third trimester, fetus 1: Secondary | ICD-10-CM | POA: Diagnosis not present

## 2024-02-22 DIAGNOSIS — Z1339 Encounter for screening examination for other mental health and behavioral disorders: Secondary | ICD-10-CM

## 2024-02-22 NOTE — Patient Instructions (Addendum)
 Induction of labor scheduled Mar 11, 2024 morning  Return to office for any scheduled appointments. Call the office or go to the MAU at [facility] if: You begin to have strong, frequent contractions Your water breaks.  Sometimes it is a big gush of fluid, sometimes it is just a trickle that keeps getting your underwear wet or running down your legs You have vaginal bleeding.  It is normal to have a small amount of spotting if your cervix was checked.  You do not feel your baby moving like normal.  If you do not, get something to eat and drink and lay down and focus on feeling your baby move.   If your baby is still not moving like normal, you should call the office or go to MAU. Any other obstetric concerns.

## 2024-02-22 NOTE — Progress Notes (Signed)
 Pt here for NST, reassuring and reactive.

## 2024-02-22 NOTE — Progress Notes (Addendum)
   PRENATAL VISIT NOTE  Subjective:  Tiffany Bowman is a 33 y.o. G3P1011 at [redacted]w[redacted]d being seen today for ongoing prenatal care.  She is currently monitored for the following issues for this high-risk pregnancy and has Generalized anxiety disorder with panic attacks; History of gestational hypertension; Supervision of high risk pregnancy; Obesity in pregnancy, antepartum; Prediabetes in mother during pregnancy; and Decreased fetal movements in third trimester on their problem list.  Patient reports  improved fetal movements but she is still concerned.  Reassured by weekly testing .  Contractions: Not present. Vag. Bleeding: None.  Movement: Present. Denies leaking of fluid.   The following portions of the patient's history were reviewed and updated as appropriate: allergies, current medications, past family history, past medical history, past social history, past surgical history and problem list.   Objective:   Vitals:   02/22/24 0943  BP: 112/77  Pulse: 87    Fetal Status: Fetal Heart Rate (bpm): NST   Movement: Present     General:  Alert, oriented and cooperative. Patient is in no acute distress.  Skin: Skin is warm and dry. No rash noted.   Cardiovascular: Normal heart rate noted  Respiratory: Normal respiratory effort, no problems with respiration noted  Abdomen: Soft, gravid, appropriate for gestational age.  Pain/Pressure: Absent     Pelvic: Cultures obtained in the presence of a chaperone, cervical exam deferred by patient        Extremities: Normal range of motion.     Mental Status: Normal mood and affect. Normal behavior. Normal judgment and thought content.   Assessment and Plan:  Pregnancy: G3P1011 at [redacted]w[redacted]d 1. Decreased fetal movements in third trimester NST performed today was reviewed and was found to be reactive.  Continue recommended antenatal testing and prenatal care. IOL scheduled at 39 weeks, patient desired 03/11/24 date. Orders signed and held.   2. [redacted] weeks  gestation of pregnancy 3. Supervision of other normal pregnancy, antepartum (Primary) Cultures done, will follow up results and manage accordingly. - Cervicovaginal ancillary only - Strep Gp B Culture+Rflx Preterm labor symptoms and general obstetric precautions including but not limited to vaginal bleeding, contractions, leaking of fluid and fetal movement were reviewed in detail with the patient. Please refer to After Visit Summary for other counseling recommendations.   Return in about 1 week (around 02/29/2024) for NST, BPP, OFFICE OB VISIT (MD only).  Future Appointments  Date Time Provider Department Center  02/29/2024  8:15 AM [facility] NST [facility]  02/29/2024  8:55 AM Shelle Galdamez, Kathrine Paris, MD [facility]  03/07/2024  9:35 AM Niccole Witthuhn, Kathrine Paris, MD [facility]  03/07/2024 10:15 AM [facility] NST [facility]  03/11/2024  6:45 AM [facility] SCHED ROOM [facility] None    Lenoard Rad, MD

## 2024-02-23 LAB — CERVICOVAGINAL ANCILLARY ONLY
Chlamydia: NEGATIVE
Comment: NEGATIVE
Comment: NORMAL
Neisseria Gonorrhea: NEGATIVE

## 2024-02-24 ENCOUNTER — Encounter: Payer: Self-pay | Admitting: Obstetrics & Gynecology

## 2024-02-24 ENCOUNTER — Telehealth (HOSPITAL_COMMUNITY): Payer: Self-pay | Admitting: *Deleted

## 2024-02-24 ENCOUNTER — Encounter (HOSPITAL_COMMUNITY): Payer: Self-pay | Admitting: *Deleted

## 2024-02-24 NOTE — Telephone Encounter (Signed)
 Preadmission screen

## 2024-02-25 LAB — STREP GP B CULTURE+RFLX: Strep Gp B Culture+Rflx: NEGATIVE

## 2024-02-28 ENCOUNTER — Encounter: Payer: Self-pay | Admitting: Obstetrics & Gynecology

## 2024-02-29 ENCOUNTER — Ambulatory Visit: Admitting: *Deleted

## 2024-02-29 ENCOUNTER — Encounter: Payer: Self-pay | Admitting: Obstetrics & Gynecology

## 2024-02-29 ENCOUNTER — Ambulatory Visit (INDEPENDENT_AMBULATORY_CARE_PROVIDER_SITE_OTHER): Admitting: Obstetrics & Gynecology

## 2024-02-29 VITALS — BP 120/81 | HR 85

## 2024-02-29 DIAGNOSIS — Z3A37 37 weeks gestation of pregnancy: Secondary | ICD-10-CM

## 2024-02-29 DIAGNOSIS — Z3480 Encounter for supervision of other normal pregnancy, unspecified trimester: Secondary | ICD-10-CM

## 2024-02-29 DIAGNOSIS — O99713 Diseases of the skin and subcutaneous tissue complicating pregnancy, third trimester: Secondary | ICD-10-CM | POA: Diagnosis not present

## 2024-02-29 DIAGNOSIS — O368130 Decreased fetal movements, third trimester, not applicable or unspecified: Secondary | ICD-10-CM

## 2024-02-29 DIAGNOSIS — L299 Pruritus, unspecified: Secondary | ICD-10-CM

## 2024-02-29 NOTE — Patient Instructions (Signed)

## 2024-02-29 NOTE — Progress Notes (Addendum)
   PRENATAL VISIT NOTE  Subjective:  Tiffany Bowman is a 33 y.o. G3P1011 at [redacted]w[redacted]d being seen today for ongoing prenatal care.  She is currently monitored for the following issues for this high-risk pregnancy and has Generalized anxiety disorder with panic attacks; History of gestational hypertension; Supervision of high risk pregnancy; Obesity in pregnancy, antepartum; Prediabetes in mother during pregnancy; and Decreased fetal movements in third trimester on their problem list.  Patient reports  continued improved fetal movements but she is still concerned.  Reassured by weekly testing . Also wants cervical check today. Also reports generalized itching all over that was worse last night, thinks it is due to seasonal allergies but wants to make sure.  Minimal itching in palms and soles.   Contractions: Irritability. Vag. Bleeding: None.  Movement: Present. Denies leaking of fluid.   The following portions of the patient's history were reviewed and updated as appropriate: allergies, current medications, past family history, past medical history, past social history, past surgical history and problem list.   Objective:   Vitals:   02/29/24 0822  BP: 120/81  Pulse: 85    Fetal Status: Fetal Heart Rate (bpm): NST   Movement: Present  Presentation: Vertex  General:  Alert, oriented and cooperative. Patient is in no acute distress.  Skin: Skin is warm and dry. No rash noted.   Cardiovascular: Normal heart rate noted  Respiratory: Normal respiratory effort, no problems with respiration noted  Abdomen: Soft, gravid, appropriate for gestational age.  Pain/Pressure: Absent     Pelvic: Cervical exam done in presence of a chaperone Dilation: 3 Effacement (%): Thick Station: Ballotable  Extremities: Normal range of motion.  Edema: None  Mental Status: Normal mood and affect. Normal behavior. Normal judgment and thought content.   Assessment and Plan:  Pregnancy: G3P1011 at [redacted]w[redacted]d 1. Pruritus of  pregnancy in third trimester Recommended antihistamines for now, but will check cholestasis labs.  Will follow up results and manage accordingly. - Comprehensive metabolic panel with GFR - Bile acids, total  2. Decreased fetal movements in third trimester NST performed today was reviewed and was found to be reactive. Continue recommended antenatal testing and prenatal care. IOL scheduled at 39 weeks, patient desired 03/11/24 date. Orders already signed and held.   3. [redacted] weeks gestation of pregnancy 4. Supervision of other normal pregnancy, antepartum (Primary) Negative pelvic cultures Term labor symptoms and general obstetric precautions including but not limited to vaginal bleeding, contractions, leaking of fluid and fetal movement were reviewed in detail with the patient. Please refer to After Visit Summary for other counseling recommendations.   Return in about 1 week (around 03/07/2024) for OB visits and antenatal testing as scheduled.  Future Appointments  Date Time Provider Department Center  03/07/2024  9:35 AM Zion Lint, Kathrine Paris, MD [facility]  03/07/2024 10:15 AM [facility] NST [facility]  03/11/2024  6:45 AM [facility]ED RO[facility] None    Lenoard Rad, MD

## 2024-02-29 NOTE — Progress Notes (Addendum)
 NST-reassuring and reactive.

## 2024-03-01 ENCOUNTER — Encounter: Payer: Self-pay | Admitting: Obstetrics & Gynecology

## 2024-03-01 LAB — COMPREHENSIVE METABOLIC PANEL WITH GFR
ALT: 10 IU/L (ref 0–32)
AST: 15 IU/L (ref 0–40)
Albumin: 3.8 g/dL — ABNORMAL LOW (ref 3.9–4.9)
Alkaline Phosphatase: 121 IU/L (ref 44–121)
BUN/Creatinine Ratio: 9 (ref 9–23)
BUN: 5 mg/dL — ABNORMAL LOW (ref 6–20)
Bilirubin Total: 0.2 mg/dL (ref 0.0–1.2)
CO2: 21 mmol/L (ref 20–29)
Calcium: 8.9 mg/dL (ref 8.7–10.2)
Chloride: 101 mmol/L (ref 96–106)
Creatinine, Ser: 0.58 mg/dL (ref 0.57–1.00)
Globulin, Total: 2.6 g/dL (ref 1.5–4.5)
Glucose: 81 mg/dL (ref 70–99)
Potassium: 4.1 mmol/L (ref 3.5–5.2)
Sodium: 136 mmol/L (ref 134–144)
Total Protein: 6.4 g/dL (ref 6.0–8.5)
eGFR: 124 mL/min/{1.73_m2} (ref >59)

## 2024-03-01 LAB — BILE ACIDS, TOTAL: Bile Acids Total: 1.1 umol/L (ref 0.0–10.0)

## 2024-03-07 ENCOUNTER — Encounter: Payer: Self-pay | Admitting: Obstetrics & Gynecology

## 2024-03-07 ENCOUNTER — Ambulatory Visit (INDEPENDENT_AMBULATORY_CARE_PROVIDER_SITE_OTHER): Admitting: Obstetrics & Gynecology

## 2024-03-07 ENCOUNTER — Ambulatory Visit: Admitting: *Deleted

## 2024-03-07 VITALS — BP 118/78 | HR 87 | Wt 184.0 lb

## 2024-03-07 DIAGNOSIS — O368130 Decreased fetal movements, third trimester, not applicable or unspecified: Secondary | ICD-10-CM | POA: Diagnosis not present

## 2024-03-07 DIAGNOSIS — Z3A38 38 weeks gestation of pregnancy: Secondary | ICD-10-CM | POA: Diagnosis not present

## 2024-03-07 DIAGNOSIS — Z3480 Encounter for supervision of other normal pregnancy, unspecified trimester: Secondary | ICD-10-CM

## 2024-03-07 DIAGNOSIS — O368131 Decreased fetal movements, third trimester, fetus 1: Secondary | ICD-10-CM

## 2024-03-07 NOTE — Progress Notes (Signed)
   PRENATAL VISIT NOTE  Subjective:  Tiffany Bowman is a 33 y.o. G3P1011 at [redacted]w[redacted]d being seen today for ongoing prenatal care.  She is currently monitored for the following issues for this high-risk pregnancy and has Generalized anxiety disorder with panic attacks; History of gestational hypertension; Supervision of high risk pregnancy; Obesity in pregnancy, antepartum; Prediabetes in mother during pregnancy; and Decreased fetal movements in third trimester on their problem list.  Patient reports occasional contractions.  Contractions: Irritability. Vag. Bleeding: None.  Movement: Present. Denies leaking of fluid.   The following portions of the patient's history were reviewed and updated as appropriate: allergies, current medications, past family history, past medical history, past social history, past surgical history and problem list.   Objective:   Vitals:   03/07/24 0938  BP: 118/78  Pulse: 87  Weight: 184 lb (83.5 kg)    Fetal Status: Fetal Heart Rate (bpm): 144 Fundal Height: 38 cm Movement: Present  Presentation: Vertex  General:  Alert, oriented and cooperative. Patient is in no acute distress.  Skin: Skin is warm and dry. No rash noted.   Cardiovascular: Normal heart rate noted  Respiratory: Normal respiratory effort, no problems with respiration noted  Abdomen: Soft, gravid, appropriate for gestational age.  Pain/Pressure: Present     Pelvic: Cervical exam performed in the presence of a chaperone Ezequiel Holm, CMA) Dilation: 3 Effacement (%): 50 Station: -3  Extremities: Normal range of motion.  Edema: None  Mental Status: Normal mood and affect. Normal behavior. Normal judgment and thought content.   Assessment and Plan:  Pregnancy: G3P1011 at [redacted]w[redacted]d 1. Decreased fetal movements in third trimester, single or unspecified fetus (Primary) 2. [redacted] weeks gestation of pregnancy 3. Supervision of other normal pregnancy, antepartum NST performed today was reviewed and was found  to be reactive. IOL scheduled at 39 weeks, on 03/11/24. Orders already signed and held. Favorable cervix already, patient reassured.  Negative pelvic cultures Term labor symptoms and general obstetric precautions including but not limited to vaginal bleeding, contractions, leaking of fluid and fetal movement were reviewed in detail with the patient.   Please refer to After Visit Summary for other counseling recommendations.   Return for Postpartum check.  Future Appointments  Date Time Provider Department Center  03/07/2024 10:15 AM [facility] NST [facility]  03/11/2024  6:45 AM [facility] SCHED ROOM [facility] None    Lenoard Rad, MD

## 2024-03-07 NOTE — Patient Instructions (Signed)

## 2024-03-07 NOTE — Progress Notes (Signed)
 NST-reassuring and reactive.

## 2024-03-09 ENCOUNTER — Inpatient Hospital Stay (HOSPITAL_COMMUNITY)
Admission: AD | Admit: 2024-03-09 | Discharge: 2024-03-09 | Disposition: A | Discharge status: Home / Self Care | Attending: Obstetrics and Gynecology | Admitting: Obstetrics and Gynecology

## 2024-03-09 ENCOUNTER — Other Ambulatory Visit: Payer: Self-pay

## 2024-03-09 ENCOUNTER — Telehealth: Payer: Self-pay | Admitting: *Deleted

## 2024-03-09 DIAGNOSIS — Z3A39 39 weeks gestation of pregnancy: Secondary | ICD-10-CM

## 2024-03-09 DIAGNOSIS — R519 Headache, unspecified: Secondary | ICD-10-CM | POA: Insufficient documentation

## 2024-03-09 DIAGNOSIS — O163 Unspecified maternal hypertension, third trimester: Secondary | ICD-10-CM

## 2024-03-09 DIAGNOSIS — I10 Essential (primary) hypertension: Secondary | ICD-10-CM | POA: Diagnosis present

## 2024-03-09 LAB — CBC
HCT: 35.3 % — ABNORMAL LOW (ref 36.0–46.0)
Hemoglobin: 11.8 g/dL — ABNORMAL LOW (ref 12.0–15.0)
MCH: 30.1 pg (ref 26.0–34.0)
MCHC: 33.4 g/dL (ref 30.0–36.0)
MCV: 90.1 fL (ref 80.0–100.0)
Platelets: 165 10*3/uL (ref 150–400)
RBC: 3.92 MIL/uL (ref 3.87–5.11)
RDW: 13.1 % (ref 11.5–15.5)
WBC: 8.2 10*3/uL (ref 4.0–10.5)
nRBC: 0 % (ref 0.0–0.2)

## 2024-03-09 LAB — URINALYSIS, ROUTINE W REFLEX MICROSCOPIC
Bilirubin Urine: NEGATIVE
Glucose, UA: NEGATIVE mg/dL
Hgb urine dipstick: NEGATIVE
Ketones, ur: NEGATIVE mg/dL
Nitrite: NEGATIVE
Protein, ur: NEGATIVE mg/dL
Specific Gravity, Urine: 1.009 (ref 1.005–1.030)
pH: 6 (ref 5.0–8.0)

## 2024-03-09 LAB — COMPREHENSIVE METABOLIC PANEL WITH GFR
ALT: 12 U/L (ref 0–44)
AST: 19 U/L (ref 15–41)
Albumin: 2.8 g/dL — ABNORMAL LOW (ref 3.5–5.0)
Alkaline Phosphatase: 89 U/L (ref 38–126)
Anion gap: 9 (ref 5–15)
BUN: 7 mg/dL (ref 6–20)
CO2: 22 mmol/L (ref 22–32)
Calcium: 8.8 mg/dL — ABNORMAL LOW (ref 8.9–10.3)
Chloride: 105 mmol/L (ref 98–111)
Creatinine, Ser: 0.64 mg/dL (ref 0.44–1.00)
GFR, Estimated: >60 mL/min (ref >60)
Glucose, Bld: 101 mg/dL — ABNORMAL HIGH (ref 70–99)
Potassium: 3.5 mmol/L (ref 3.5–5.1)
Sodium: 136 mmol/L (ref 135–145)
Total Bilirubin: 0.4 mg/dL (ref 0.0–1.2)
Total Protein: 6 g/dL — ABNORMAL LOW (ref 6.5–8.1)

## 2024-03-09 LAB — PROTEIN / CREATININE RATIO, URINE
Creatinine, Urine: 55 mg/dL
Protein Creatinine Ratio: 0.15 mg/mg{creat} (ref 0.00–0.15)
Total Protein, Urine: 8 mg/dL

## 2024-03-09 NOTE — MAU Provider Note (Signed)
 History     CSN: [id number]  Arrival date and time: 03/09/24 1653   None     Chief Complaint  Patient presents with   Hypertension   HPI Patient presenting for elevated blood pressure at home.  Reports that she initially checked her blood pressure at home because she was feeling off and blood pressure was 129/90.  Repeat was 148/90.  She called the clinic and was recommended to come here.  She had a headache which is completely resolved at this time.  Denies any visual changes or right upper quadrant pain.  Reports no other symptoms at this time.  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   Overview:   Mild herniation L5     Pap smear abnormality of cervix     Past Surgical History:  Procedure Laterality Date   WISDOM TOOTH EXTRACTION      Family History  Problem Relation Age of Onset   Hypertension Mother    Glaucoma Father    Anxiety disorder Sister    Diabetes Maternal Grandmother    Cancer Paternal Grandmother        breast cancer   Colon cancer Neg Hx     Social History   Tobacco Use   Smoking status: Never   Smokeless tobacco: Never  Vaping Use   Vaping status: Never Used  Substance Use Topics   Alcohol use: Not Currently    Alcohol/week: 2.0 standard drinks of alcohol    Types: 2 Glasses of wine per week   Drug use: Never    Allergies:  Allergies  Allergen Reactions   Azithromycin Itching   Prozac [Fluoxetine Hcl] Itching    tching   Penicillins Rash    Medications Prior to Admission  Medication Sig Dispense Refill Last Dose/Taking   albuterol  (VENTOLIN  HFA) 108 (90 Base) MCG/ACT inhaler Inhale 1-2 puffs into the lungs every 6 (six)  hours as needed for wheezing or shortness of breath. 18 g 0    aspirin  EC 81 MG tablet Take 2 tablets (162 mg total) by mouth at bedtime. Start taking when you are [redacted] weeks pregnant for rest of pregnancy for prevention of preeclampsia 300 tablet 2    Ferrous Sulfate  (IRON PO) Take 1 tablet by mouth daily.      ondansetron  (ZOFRAN -ODT) 4 MG disintegrating tablet Take 1 tablet (4 mg total) by mouth every 6 (six) hours as needed for nausea. 20 tablet 0    Prenatal Multivit-Min-Fe-FA (PRE-NATAL PO) Take 1 capsule by mouth daily in the afternoon.      promethazine -dextromethorphan (PROMETHAZINE -DM) 6.25-15 MG/5ML syrup Take 5 mLs by mouth 4 (four) times daily as needed for cough. (Patient not taking: Reported on 01/11/2024) 118 mL 0     Review of Systems  Constitutional:  Positive for fatigue.  Gastrointestinal:  Negative for abdominal pain, nausea and vomiting.  Genitourinary:  Negative for vaginal bleeding.  Neurological:  Positive for headaches.  Physical Exam   Blood pressure 122/75, pulse (!) 102, resp. rate 16, last menstrual period 06/10/2023.  Physical Exam Vitals and nursing note reviewed.  Constitutional:      Appearance: Normal appearance.  HENT:     Head: Normocephalic and atraumatic.     Nose: No congestion or rhinorrhea.  Eyes:     Extraocular Movements: Extraocular movements intact.  Cardiovascular:     Rate and Rhythm: Normal rate.  Pulmonary:     Effort: Pulmonary effort is normal.  Abdominal:     Palpations: Abdomen is soft.     Tenderness: There is no abdominal tenderness.     Comments: Gravid  Musculoskeletal:        General: Normal range of motion.     Cervical back: Normal range of motion.  Skin:    General: Skin is warm.     Capillary Refill: Capillary refill takes less than 2 seconds.  Neurological:     General: No focal deficit present.     Mental Status: She is alert.     Cranial Nerves: No cranial nerve deficit.  Psychiatric:        Mood and Affect:  Mood normal.        Behavior: Behavior normal.     MAU Course  Procedures  MDM CBC CMP PC ratio NST   Assessment and Plan  KYMIRA THERIEN is a 33 yo G3P1011 presenting for concern for elevated blood pressures.  Elevated blood pressure affecting pregnancy Patient presenting for elevated blood pressures at home.  Initial blood pressure in the MAU 130s/80s.  Blood pressures after that were 120s/60s to 70s.  CBC showed hemoglobin of 11.8, platelets 165.  CMP grossly normal.  PC ratio 0.15.  Patient is scheduled for induction on 5/10.  Patient discharged home with strict return precautions.  Delbra Zellars V Dereonna Lensing 03/09/2024, 7:34 PM

## 2024-03-09 NOTE — Discharge Instructions (Addendum)
 It was a pleasure taking care of you tonight.  Your lab work came back completely normal and your blood pressures were also normal throughout your stay in the MAU.  If you have any symptoms such as headache that does not go away with Tylenol , right upper quadrant pain, change in vision you need to return for evaluation immediately.  Please be sure to come for your induction on 5/10.  I hope you have a wonderful night!

## 2024-03-09 NOTE — Telephone Encounter (Signed)
 Pt called and wanted to see what she needed to do. Pt got home and had a HA and took her BP and it was 129/90, pt also had been busy all day and having some allergy symptoms. Pt has been resting and drinking water and took an extra baby aspirin  and is feeling better.   Advised pt as long as HA is better and blood pressure is not greater that 140/90, then she can continue to monitor from home. Advised that if HA does come back and its worse and has an elevated BP to go right to the hospital she will not need to call the office.   Also advised pt she can check her BP once a day, after she has been sitting for a bit  until her IOL this weekend, and if normal then she can wait till IOL.   Pt verbalizes and understands

## 2024-03-09 NOTE — MAU Note (Signed)
 Tiffany Bowman is a 33 y.o. at [redacted]w[redacted]d here in MAU reporting: Patient reports this morning was busy. It is her daughters birthday. Patient reports checking her blood pressure 129/90. Patient reports she rechecked it 148/90. Reports headache   Denies visual changes  Denies right epigastric pain Reports feeling lethargic every day Onset of complaint: today Pain score: denies There were no vitals filed for this visit.    Lab orders placed from triage:  ua

## 2024-03-11 ENCOUNTER — Inpatient Hospital Stay (HOSPITAL_COMMUNITY): Admitting: Anesthesiology

## 2024-03-11 ENCOUNTER — Other Ambulatory Visit: Payer: Self-pay

## 2024-03-11 ENCOUNTER — Inpatient Hospital Stay (HOSPITAL_COMMUNITY)
Admission: RE | Admit: 2024-03-11 | Discharge: 2024-03-13 | DRG: 768 | Disposition: A | Discharge status: Home / Self Care | Attending: Obstetrics and Gynecology | Admitting: Obstetrics and Gynecology

## 2024-03-11 ENCOUNTER — Encounter (HOSPITAL_COMMUNITY): Payer: Self-pay | Admitting: Obstetrics & Gynecology

## 2024-03-11 ENCOUNTER — Inpatient Hospital Stay (HOSPITAL_COMMUNITY)

## 2024-03-11 DIAGNOSIS — R062 Wheezing: Secondary | ICD-10-CM | POA: Diagnosis not present

## 2024-03-11 DIAGNOSIS — O368130 Decreased fetal movements, third trimester, not applicable or unspecified: Secondary | ICD-10-CM | POA: Diagnosis present

## 2024-03-11 DIAGNOSIS — O99810 Abnormal glucose complicating pregnancy: Secondary | ICD-10-CM | POA: Diagnosis present

## 2024-03-11 DIAGNOSIS — Z8759 Personal history of other complications of pregnancy, childbirth and the puerperium: Secondary | ICD-10-CM

## 2024-03-11 DIAGNOSIS — F411 Generalized anxiety disorder: Secondary | ICD-10-CM | POA: Diagnosis present

## 2024-03-11 DIAGNOSIS — Z8249 Family history of ischemic heart disease and other diseases of the circulatory system: Secondary | ICD-10-CM | POA: Diagnosis not present

## 2024-03-11 DIAGNOSIS — R059 Cough, unspecified: Secondary | ICD-10-CM | POA: Diagnosis not present

## 2024-03-11 DIAGNOSIS — F410 Panic disorder [episodic paroxysmal anxiety] without agoraphobia: Secondary | ICD-10-CM | POA: Diagnosis present

## 2024-03-11 DIAGNOSIS — Z833 Family history of diabetes mellitus: Secondary | ICD-10-CM | POA: Diagnosis not present

## 2024-03-11 DIAGNOSIS — Z3A39 39 weeks gestation of pregnancy: Secondary | ICD-10-CM

## 2024-03-11 DIAGNOSIS — O99210 Obesity complicating pregnancy, unspecified trimester: Secondary | ICD-10-CM | POA: Diagnosis present

## 2024-03-11 DIAGNOSIS — Z3480 Encounter for supervision of other normal pregnancy, unspecified trimester: Secondary | ICD-10-CM

## 2024-03-11 DIAGNOSIS — D62 Acute posthemorrhagic anemia: Secondary | ICD-10-CM | POA: Diagnosis not present

## 2024-03-11 DIAGNOSIS — O99214 Obesity complicating childbirth: Secondary | ICD-10-CM | POA: Diagnosis present

## 2024-03-11 DIAGNOSIS — O9081 Anemia of the puerperium: Secondary | ICD-10-CM | POA: Diagnosis not present

## 2024-03-11 DIAGNOSIS — Z880 Allergy status to penicillin: Secondary | ICD-10-CM

## 2024-03-11 DIAGNOSIS — O99344 Other mental disorders complicating childbirth: Secondary | ICD-10-CM | POA: Diagnosis not present

## 2024-03-11 LAB — CBC
HCT: 38.8 % (ref 36.0–46.0)
Hemoglobin: 12.9 g/dL (ref 12.0–15.0)
MCH: 30.3 pg (ref 26.0–34.0)
MCHC: 33.2 g/dL (ref 30.0–36.0)
MCV: 91.1 fL (ref 80.0–100.0)
Platelets: 169 10*3/uL (ref 150–400)
RBC: 4.26 MIL/uL (ref 3.87–5.11)
RDW: 13.3 % (ref 11.5–15.5)
WBC: 7.7 10*3/uL (ref 4.0–10.5)
nRBC: 0 % (ref 0.0–0.2)

## 2024-03-11 LAB — TYPE AND SCREEN
ABO/RH(D): A POS
Antibody Screen: NEGATIVE

## 2024-03-11 LAB — RPR: RPR Ser Ql: NONREACTIVE

## 2024-03-11 MED ORDER — TERBUTALINE SULFATE 1 MG/ML IJ SOLN: 0.2500 mg | Freq: Once | INTRAMUSCULAR | Status: DC | PRN

## 2024-03-11 MED ORDER — OXYTOCIN-SODIUM CHLORIDE 30-0.9 UT/500ML-% IV SOLN
2.5000 [IU]/h | INTRAVENOUS | Status: DC
  Administered 2024-03-11: 2.5 [IU]/h via INTRAVENOUS

## 2024-03-11 MED ORDER — BENZOCAINE-MENTHOL 20-0.5 % EX AERO
1.0000 | INHALATION_SPRAY | CUTANEOUS | Status: DC | PRN
  Administered 2024-03-11: 1 via TOPICAL
  Filled 2024-03-11: qty 56

## 2024-03-11 MED ORDER — PRENATAL MULTIVITAMIN CH
1.0000 | ORAL_TABLET | Freq: Every day | ORAL | Status: DC
  Administered 2024-03-12 – 2024-03-13 (×2): 1 via ORAL
  Filled 2024-03-11 (×2): qty 1

## 2024-03-11 MED ORDER — CEFAZOLIN SODIUM-DEXTROSE 2-4 GM/100ML-% IV SOLN: 2.0000 g | Freq: Three times a day (TID) | INTRAVENOUS | Status: DC

## 2024-03-11 MED ORDER — SOD CITRATE-CITRIC ACID 500-334 MG/5ML PO SOLN: 30.0000 mL | ORAL | Status: DC | PRN

## 2024-03-11 MED ORDER — LIDOCAINE HCL (PF) 1 % IJ SOLN: 30.0000 mL | INTRAMUSCULAR | Status: DC | PRN

## 2024-03-11 MED ORDER — EPHEDRINE 5 MG/ML INJ: 10.0000 mg | INTRAVENOUS | Status: DC | PRN

## 2024-03-11 MED ORDER — SODIUM CHLORIDE 0.9 % IV SOLN: 250.0000 mL | INTRAVENOUS | Status: DC | PRN

## 2024-03-11 MED ORDER — ONDANSETRON HCL 4 MG/2ML IJ SOLN
4.0000 mg | Freq: Four times a day (QID) | INTRAMUSCULAR | Status: DC | PRN
  Administered 2024-03-11: 4 mg via INTRAVENOUS
  Filled 2024-03-11: qty 2

## 2024-03-11 MED ORDER — SODIUM CHLORIDE 0.9% FLUSH: 3.0000 mL | INTRAVENOUS | Status: DC | PRN

## 2024-03-11 MED ORDER — FENTANYL-BUPIVACAINE-NACL 0.5-0.125-0.9 MG/250ML-% EP SOLN
12.0000 mL/h | EPIDURAL | Status: DC | PRN
  Administered 2024-03-11: 12 mL/h via EPIDURAL
  Filled 2024-03-11: qty 250

## 2024-03-11 MED ORDER — WITCH HAZEL-GLYCERIN EX PADS
1.0000 | MEDICATED_PAD | CUTANEOUS | Status: DC | PRN
  Administered 2024-03-11: 1 via TOPICAL

## 2024-03-11 MED ORDER — OXYTOCIN BOLUS FROM INFUSION
333.0000 mL | Freq: Once | INTRAVENOUS | Status: AC
  Administered 2024-03-11: 333 mL via INTRAVENOUS

## 2024-03-11 MED ORDER — SIMETHICONE 80 MG PO CHEW: 80.0000 mg | CHEWABLE_TABLET | ORAL | Status: DC | PRN

## 2024-03-11 MED ORDER — SODIUM CHLORIDE 0.9% FLUSH
3.0000 mL | Freq: Two times a day (BID) | INTRAVENOUS | Status: DC
  Administered 2024-03-11: 3 mL via INTRAVENOUS

## 2024-03-11 MED ORDER — CEFAZOLIN SODIUM-DEXTROSE 2-4 GM/100ML-% IV SOLN
2.0000 g | Freq: Once | INTRAVENOUS | Status: AC
  Administered 2024-03-11: 2 g via INTRAVENOUS
  Filled 2024-03-11: qty 100

## 2024-03-11 MED ORDER — LACTATED RINGERS IV SOLN: 500.0000 mL | Freq: Once | INTRAVENOUS | Status: DC

## 2024-03-11 MED ORDER — LACTATED RINGERS IV SOLN: 500.0000 mL | INTRAVENOUS | Status: DC | PRN

## 2024-03-11 MED ORDER — PHENYLEPHRINE 80 MCG/ML (10ML) SYRINGE FOR IV PUSH (FOR BLOOD PRESSURE SUPPORT)
80.0000 ug | PREFILLED_SYRINGE | INTRAVENOUS | Status: DC | PRN
  Filled 2024-03-11: qty 10

## 2024-03-11 MED ORDER — FENTANYL CITRATE (PF) 100 MCG/2ML IJ SOLN: 50.0000 ug | INTRAMUSCULAR | Status: DC | PRN

## 2024-03-11 MED ORDER — ZOLPIDEM TARTRATE 5 MG PO TABS: 5.0000 mg | ORAL_TABLET | Freq: Every evening | ORAL | Status: DC | PRN

## 2024-03-11 MED ORDER — COCONUT OIL OIL: 1.0000 | TOPICAL_OIL | Status: DC | PRN

## 2024-03-11 MED ORDER — IBUPROFEN 600 MG PO TABS
600.0000 mg | ORAL_TABLET | Freq: Four times a day (QID) | ORAL | Status: DC
  Administered 2024-03-11 – 2024-03-13 (×7): 600 mg via ORAL
  Filled 2024-03-11 (×7): qty 1

## 2024-03-11 MED ORDER — MISOPROSTOL 25 MCG QUARTER TABLET: 25.0000 ug | ORAL_TABLET | Freq: Once | ORAL | Status: DC

## 2024-03-11 MED ORDER — ACETAMINOPHEN 325 MG PO TABS
650.0000 mg | ORAL_TABLET | ORAL | Status: DC | PRN
  Administered 2024-03-11: 650 mg via ORAL
  Filled 2024-03-11: qty 2

## 2024-03-11 MED ORDER — SENNOSIDES-DOCUSATE SODIUM 8.6-50 MG PO TABS
2.0000 | ORAL_TABLET | ORAL | Status: DC
  Administered 2024-03-11 – 2024-03-12 (×2): 2 via ORAL
  Filled 2024-03-11 (×2): qty 2

## 2024-03-11 MED ORDER — ACETAMINOPHEN 325 MG PO TABS: 650.0000 mg | ORAL_TABLET | ORAL | Status: DC | PRN

## 2024-03-11 MED ORDER — MISOPROSTOL 50MCG HALF TABLET: 50.0000 ug | ORAL_TABLET | Freq: Once | ORAL | Status: DC

## 2024-03-11 MED ORDER — OXYCODONE-ACETAMINOPHEN 5-325 MG PO TABS: 2.0000 | ORAL_TABLET | ORAL | Status: DC | PRN

## 2024-03-11 MED ORDER — DIPHENHYDRAMINE HCL 50 MG/ML IJ SOLN: 12.5000 mg | INTRAMUSCULAR | Status: DC | PRN

## 2024-03-11 MED ORDER — ONDANSETRON HCL 4 MG/2ML IJ SOLN: 4.0000 mg | INTRAMUSCULAR | Status: DC | PRN

## 2024-03-11 MED ORDER — ONDANSETRON HCL 4 MG PO TABS: 4.0000 mg | ORAL_TABLET | ORAL | Status: DC | PRN

## 2024-03-11 MED ORDER — LACTATED RINGERS AMNIOINFUSION: INTRAVENOUS | Status: DC

## 2024-03-11 MED ORDER — DIBUCAINE (PERIANAL) 1 % EX OINT: 1.0000 | TOPICAL_OINTMENT | CUTANEOUS | Status: DC | PRN

## 2024-03-11 MED ORDER — PHENYLEPHRINE 80 MCG/ML (10ML) SYRINGE FOR IV PUSH (FOR BLOOD PRESSURE SUPPORT)
80.0000 ug | PREFILLED_SYRINGE | INTRAVENOUS | Status: DC | PRN
  Administered 2024-03-11 (×2): 80 ug via INTRAVENOUS

## 2024-03-11 MED ORDER — LACTATED RINGERS IV SOLN: INTRAVENOUS | Status: DC

## 2024-03-11 MED ORDER — HYDROXYZINE HCL 50 MG PO TABS: 50.0000 mg | ORAL_TABLET | Freq: Four times a day (QID) | ORAL | Status: DC | PRN

## 2024-03-11 MED ORDER — LIDOCAINE HCL (PF) 1 % IJ SOLN
INTRAMUSCULAR | Status: DC | PRN
  Administered 2024-03-11: 8 mL via EPIDURAL

## 2024-03-11 MED ORDER — OXYTOCIN-SODIUM CHLORIDE 30-0.9 UT/500ML-% IV SOLN
1.0000 m[IU]/min | INTRAVENOUS | Status: DC
  Administered 2024-03-11: 2 m[IU]/min via INTRAVENOUS
  Filled 2024-03-11: qty 500

## 2024-03-11 MED ORDER — FLEET ENEMA RE ENEM: 1.0000 | ENEMA | Freq: Every day | RECTAL | Status: DC | PRN

## 2024-03-11 MED ORDER — DIPHENHYDRAMINE HCL 25 MG PO CAPS: 25.0000 mg | ORAL_CAPSULE | Freq: Four times a day (QID) | ORAL | Status: DC | PRN

## 2024-03-11 MED ORDER — OXYCODONE-ACETAMINOPHEN 5-325 MG PO TABS: 1.0000 | ORAL_TABLET | ORAL | Status: DC | PRN

## 2024-03-11 MED ORDER — TRANEXAMIC ACID-NACL 1000-0.7 MG/100ML-% IV SOLN
INTRAVENOUS | Status: AC
  Administered 2024-03-11: 1000 mg
  Filled 2024-03-11: qty 100

## 2024-03-11 NOTE — Progress Notes (Signed)
 Labor Progress Note Tiffany Bowman is a 33 y.o. G3P1011 at [redacted]w[redacted]d presented for IOL S: feeling ctx more regularly. Requesting ROM  O:  BP 111/68   Pulse 96   Temp 98.5 F (36.9 C) (Oral)   Resp 16   Ht 5\' 5"  (1.651 m)   Wt 83.6 kg   LMP 06/10/2023   BMI 30.65 kg/m  EFM: 140/moderate/+accels, no decels  CVE: Dilation: 5 Effacement (%): 70 Cervical Position: Anterior Station: -2 Presentation: Vertex Exam by:: Dr. Daisey Dryer  Consented for AROM AROM performed, copious clear fluid  A&P: 33 y.o. G3P1011 [redacted]w[redacted]d IOL #Labor: Progressing well. AROM Clear fluid #Pain: planning epidural #FWB: Cat 1 #GBS negative  Abner Ables, MD 1:14 PM

## 2024-03-11 NOTE — Progress Notes (Signed)
 Faculty Note  S: In to meet patient, she is comfortable with epidural.   O: BP (!) 114/56   Pulse (!) 103   Temp 98.5 F (36.9 C) (Oral)   Resp 18   Ht 5\' 5"  (1.651 m)   Wt 83.6 kg   LMP 06/10/2023   BMI 30.65 kg/m   Gen: alert, oriented SVE: 7/90/  FHT: 130s bpm, moderate variability, accels  present, variable decels Toco: ctx every 1-3 min   A/P: Pt is 33 y.o. [id number] @ 104w2d who is admitted for induction of labor for decreased fetal movement. Has started having variable decels, amnioinfusion placed with some improvement. FHT in between variables with moderate variability with accelerations noted. Reviewed FHT with patient and husband, reviewed risks of need for c-section if FHR decels and does not resolve. Overall, she is progressing in labor and strip reassuring. Will cont pitocin  and cont to monitor.  Cont pitocin  Cont amnioinfusion   K. Andi Kaufmann, MD, [facility] Attending Center for [facility])

## 2024-03-11 NOTE — H&P (Signed)
 Obstetric History and Physical  Tiffany Bowman is a 33 y.o. G3P1011 with IUP at [redacted]w[redacted]d presenting for induction of labor for decreased fetal movement.  Patient states she has been having  irregular contractions, no vaginal bleeding, intact membranes, with active fetal movement.    Prenatal Course Source of Care: [facility] [place]  with onset of care at 9  weeks Pregnancy complications or risks: Patient Active Problem List   Diagnosis Date Noted   Decreased fetal movements in third trimester 02/22/2024   Prediabetes in mother during pregnancy 09/02/2023   Obesity in pregnancy, antepartum 08/31/2023   Supervision of high risk pregnancy 08/17/2023   History of gestational hypertension 04/07/2019   Generalized anxiety disorder with panic attacks 03/07/2017   NURSING  PROVIDER  Office Location [facility] Dating by LMP c/w U/S at 9 wks  [facility] Model Traditional Anatomy U/S Normal  Initiated care at  [facility]  English              LAB RESULTS   Support Person FOB Genetics NIPS: LR F AFP: declined    Carrier Screen Horizon: neg x 4  Rhogam  --/--/PENDING (05/10 0653) A1C/GTT Early: A1C at 11w 5.7-->88/178/106 Third trimester: Nml 2 hr GTT  Flu Vaccine 10/05/2023    TDaP Vaccine  01/11/24 Blood Type A POS (05/10 0653)  Covid Vaccine  Antibody NEGATIVE (05/10 0653)  RSV Vaccine  Rubella 14.00 (10/29 0941)  Feeding Plan Breast RPR Non Reactive (03/11 0855)  Contraception Paraguard in office HBsAg Negative (10/29 0941)  Circumcision NA HIV Non Reactive (03/11 0855)  Pediatrician   HCVAb Non Reactive (10/29 0941)  Prenatal Classes       Pap Diagnosis  Date Value Ref Range Status  07/05/2018   Final   NEGATIVE FOR INTRAEPITHELIAL LESIONS OR MALIGNANCY.    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   Overview:   Mild herniation L5     Pap smear abnormality of cervix     Past Surgical History:  Procedure Laterality Date   WISDOM TOOTH EXTRACTION      OB History  Gravida Para Term Preterm AB Living  3 1 1  0 1 1  SAB IAB Ectopic Multiple Live Births  0 1 0 0 1    # Outcome Date GA Lbr Len/2nd Weight Sex Type Anes PTL Lv  3 Current           2 Term 03/10/19 [redacted]w[redacted]d 03:05 / 00:18 4005 g F Vag-Spont EPI  LIV     Birth Comments: grunting and retractions in first hour of birth  1 IAB             Social History   Socioeconomic History   Marital status: Single    Spouse name: Not on file   Number of children: Not on file   Years of education: Not on file   Highest education level: Bachelor's degree (e.g., BA, AB, BS)  Occupational History   Not on file  Tobacco Use   Smoking status: Never   Smokeless tobacco: Never  Vaping Use   Vaping status: Never Used  Substance and Sexual Activity  Alcohol use: Not Currently    Alcohol/week: 2.0 standard drinks of alcohol    Types: 2 Glasses of wine per week   Drug use: Never   Sexual activity: Yes  Other Topics Concern   Not on file  Social History Narrative   Has 61 year old daughter (2024)   Married   Husband works with [employer]   Social Drivers of Corporate investment banker Strain: Low Risk  (08/22/2018)   Overall Financial Resource Strain (CARDIA)    Difficulty of Paying Living Expenses: Not hard at all  Food Insecurity: No Food Insecurity (03/11/2024)   Hunger Vital Sign    Worried About Running Out of Food in the Last Year: Never true    Ran Out of Food in the Last Year: Never true  Transportation Needs: No Transportation Needs (03/11/2024)   PRAPARE - Administrator, Civil Service (Medical): No    Lack of Transportation (Non-Medical): No  Physical Activity: Insufficiently Active (08/22/2018)   Exercise Vital Sign    Days of Exercise per Week: 2 days     Minutes of Exercise per Session: 60 min  Stress: No Stress Concern Present (08/22/2018)   [employer] of Occupational Health - Occupational Stress Questionnaire    Feeling of Stress : Only a little  Social Connections: Unknown (03/09/2019)   Social Connection and Isolation Panel [NHANES]    Frequency of Communication with Friends and Family: Not on file    Frequency of Social Gatherings with Friends and Family: Twice a week    Attends Religious Services: Never    Database administrator or Organizations: No    Attends Engineer, structural: Never    Marital Status: Never married    Family History  Problem Relation Age of Onset   Hypertension Mother    Glaucoma Father    Anxiety disorder Sister    Diabetes Maternal Grandmother    Cancer Paternal Grandmother        breast cancer   Colon cancer Neg Hx     Medications Prior to Admission  Medication Sig Dispense Refill Last Dose/Taking   aspirin  EC 81 MG tablet Take 2 tablets (162 mg total) by mouth at bedtime. Start taking when you are [redacted] weeks pregnant for rest of pregnancy for prevention of preeclampsia 300 tablet 2 03/10/2024   Prenatal Multivit-Min-Fe-FA (PRE-NATAL PO) Take 1 capsule by mouth daily in the afternoon.   03/10/2024   albuterol  (VENTOLIN  HFA) 108 (90 Base) MCG/ACT inhaler Inhale 1-2 puffs into the lungs every 6 (six) hours as needed for wheezing or shortness of breath. 18 g 0    Ferrous Sulfate  (IRON PO) Take 1 tablet by mouth daily.      ondansetron  (ZOFRAN -ODT) 4 MG disintegrating tablet Take 1 tablet (4 mg total) by mouth every 6 (six) hours as needed for nausea. 20 tablet 0    promethazine -dextromethorphan (PROMETHAZINE -DM) 6.25-15 MG/5ML syrup Take 5 mLs by mouth 4 (four) times daily as needed for cough. (Patient not taking: Reported on 01/11/2024) 118 mL 0     Allergies  Allergen Reactions   Azithromycin Itching   Prozac [Fluoxetine Hcl] Itching    tching   Penicillins Rash    Review of Systems:  Negative except for what is mentioned in HPI.  Physical Exam: BP 123/73   Pulse (!) 108   Temp 98 F (36.7 C) (Oral)   Resp 16   Ht 5\' 5"  (1.651 m)   Wt 83.6 kg  LMP 06/10/2023   BMI 30.65 kg/m  CONSTITUTIONAL: Well-developed, well-nourished female in no acute distress.  HENT:  Normocephalic, atraumatic, External right and left ear normal. Oropharynx is clear and moist EYES: Conjunctivae and EOM are normal. Pupils are equal, round, and reactive to light. No scleral icterus.  NECK: Normal range of motion, supple, no masses SKIN: Skin is warm and dry. No rash noted. Not diaphoretic. No erythema. No pallor. NEUROLOGIC: Alert and oriented to person, place, and time. Normal reflexes, muscle tone coordination. No cranial nerve deficit noted. PSYCHIATRIC: Normal mood and affect. Normal behavior. Normal judgment and thought content. CARDIOVASCULAR: Normal heart rate noted, regular rhythm RESPIRATORY: Effort and breath sounds normal, no problems with respiration noted ABDOMEN: Soft, nontender, nondistended, gravid. MUSCULOSKELETAL: Normal range of motion. No edema and no tenderness. 2+ distal pulses.  Cervical Exam: Dilatation 3cm   Effacement 60%   Station -3   Presentation: cephalic FHT:  Baseline rate 150 bpm   Variability moderate  Accelerations present   Decelerations none Contractions: Irregular   Pertinent Labs/Studies:   Results for orders placed or performed during the hospital encounter of 03/11/24 (from the past 24 hours)  Type and screen     Status: None (Preliminary result)   Collection Time: 03/11/24  6:53 AM  Result Value Ref Range   ABO/RH(D) PENDING    Antibody Screen PENDING    Sample Expiration      03/14/2024,2359 Performed at [facility], [address] [place], [place] [address]     Assessment : Tiffany Bowman is a 33 y.o. G3P1011 at [redacted]w[redacted]d being admitted for induction of labor due to persistently decreased fetal movement.  Plan: Labor: Induction  with pitocin  per protocol and AROM. Analgesia as needed. Decreased FM: Had reassuring antenatal testing, reactive NST FWB: Reassuring fetal heart tracing.  GBS negative Delivery plan: Hopeful for vaginal delivery   Lenoard Rad, MD, FACOG Obstetrician & Gynecologist, [facility] for [employer], [facility]

## 2024-03-11 NOTE — Discharge Summary (Signed)
 Postpartum Discharge Summary  Date of Service updated***     Patient Name: Tiffany Bowman DOB: 01-04-1991 MRN: [id number]  Date of admission: 03/11/2024 Delivery date:03/11/2024 Delivering provider: Jan Mcgill Date of discharge: 03/11/2024  Admitting diagnosis: Decreased fetal movements in third trimester, single or unspecified fetus [O36.8130] Intrauterine pregnancy: [redacted]w[redacted]d     Secondary diagnosis:  Principal Problem:   Decreased fetal movements in third trimester Active Problems:   Generalized anxiety disorder with panic attacks   History of gestational hypertension   Supervision of high risk pregnancy   Obesity in pregnancy, antepartum   Prediabetes in mother during pregnancy  Additional problems: ***    Discharge diagnosis: Term Pregnancy Delivered                                              Post partum procedures:{Postpartum procedures:23558} Augmentation: Pitocin  Complications: None  Hospital course: Induction of Labor With Vaginal Delivery   33 y.o. yo G3P1011 at [redacted]w[redacted]d was admitted to the hospital 03/11/2024 for induction of labor.  Indication for induction: decreased fetal movement.  Patient had an labor course complicated by deep variables requiring amnioinfusion. Membrane Rupture Time/Date: 1:08 PM,03/11/2024  Delivery Method:Vaginal, Spontaneous Operative Delivery:N/A Episiotomy:   Lacerations:  Perineal;1st degree Details of delivery can be found in separate delivery note.  Patient had a postpartum course complicated by***. Patient is discharged home 03/11/24.  Newborn Data: Birth date:03/11/2024 Birth time:7:02 PM Gender:Female Living status:  Apgars: ,  Weight:   Magnesium Sulfate received: No BMZ received: No Rhophylac:N/A MMR:N/A T-DaP:{Tdap:23962} Flu: Yes RSV Vaccine received: No Transfusion:No  Immunizations received: Immunization History  Administered Date(s) Administered   HPV Quadrivalent 06/06/2009   Influenza, Seasonal,  Injecte, Preservative Fre 10/05/2023   Influenza,inj,Quad PF,6+ Mos 10/18/2017, 08/22/2018   Influenza,inj,quad, With Preservative 07/05/2020   Meningococcal Conjugate 02/05/2012   PFIZER(Purple Top)SARS-COV-2 Vaccination 06/13/2020, 07/05/2020   Tdap 01/02/2019, 01/11/2024    Physical exam  Vitals:   03/11/24 1919 03/11/24 1932 03/11/24 1945 03/11/24 2018  BP: (!) 129/98 135/77 132/68 (!) 119/57  Pulse:  (!) 108 (!) 106 99  Resp:      Temp:      TempSrc:      Weight:      Height:       General: {Exam; general:21111117} Lochia: {Desc; appropriate/inappropriate:30686::"appropriate"} Uterine Fundus: {Desc; firm/soft:30687} Incision: {Exam; incision:21111123} DVT Evaluation: {Exam; dvt:2111122} Labs: Lab Results  Component Value Date   WBC 7.7 03/11/2024   HGB 12.9 03/11/2024   HCT 38.8 03/11/2024   MCV 91.1 03/11/2024   PLT 169 03/11/2024      Latest Ref Rng & Units 03/09/2024    6:34 PM  CMP  Glucose 70 - 99 mg/dL 829   BUN 6 - 20 mg/dL 7   Creatinine 5.62 - 1.30 mg/dL 8.65   Sodium 784 - 696 mmol/L 136   Potassium 3.5 - 5.1 mmol/L 3.5   Chloride 98 - 111 mmol/L 105   CO2 22 - 32 mmol/L 22   Calcium 8.9 - 10.3 mg/dL 8.8   Total Protein 6.5 - 8.1 g/dL 6.0   Total Bilirubin 0.0 - 1.2 mg/dL 0.4   Alkaline Phos 38 - 126 U/L 89   AST 15 - 41 U/L 19   ALT 0 - 44 U/L 12    Edinburgh Score:    04/07/2019  8:26 AM  Edinburgh Postnatal Depression Scale Screening Tool  I have been able to laugh and see the funny side of things. 0  I have looked forward with enjoyment to things. 0  I have blamed myself unnecessarily when things went wrong. 0  I have been anxious or worried for no good reason. 2  I have felt scared or panicky for no good reason. 0  Things have been getting on top of me. 0  I have been so unhappy that I have had difficulty sleeping. 0  I have felt sad or miserable. 0  I have been so unhappy that I have been crying. 0  The thought of harming myself has  occurred to me. 0  Edinburgh Postnatal Depression Scale Total 2   No data recorded  After visit meds:  Allergies as of 03/11/2024       Reactions   Azithromycin Itching   Prozac [fluoxetine Hcl] Itching   tching   Penicillins Rash     Med Rec must be completed prior to using this [facility]***        Discharge home in stable condition Infant Feeding: {Baby feeding:23562} Infant Disposition:{CHL IP OB HOME WITH UXLKGM:01027} Discharge instruction: per After Visit Summary and Postpartum booklet. Activity: Advance as tolerated. Pelvic rest for 6 weeks.  Diet: {OB OZDG:64403474} Future Appointments:No future appointments. Follow up Visit:   Please schedule this patient for a In person postpartum visit in 1 week with the following provider: Any provider. Additional Postpartum F/U:clitoral tear check  High risk pregnancy complicated by: h/o HTN, GAD Delivery mode:  Vaginal, Spontaneous Anticipated Birth Control:  husband plans vasectomy  Message sent to [facility] 03/11/24  03/11/2024 Jan Mcgill, MD

## 2024-03-11 NOTE — Anesthesia Procedure Notes (Signed)
 Epidural Patient location during procedure: OB Start time: 03/11/2024 4:38 PM End time: 03/11/2024 4:42 PM  Staffing Anesthesiologist: Rhenda Cedars, MD  Preanesthetic Checklist Completed: patient identified, IV checked, site marked, risks and benefits discussed, surgical consent, monitors and equipment checked, pre-op evaluation and timeout performed  Epidural Patient position: sitting Prep: DuraPrep and site prepped and draped Patient monitoring: continuous pulse ox and blood pressure Approach: midline Location: L4-L5 Injection technique: LOR air  Needle:  Needle type: Tuohy  Needle gauge: 17 G Needle length: 9 cm and 9 Needle insertion depth: 6 cm Catheter type: closed end flexible Catheter size: 19 Gauge Catheter at skin depth: 12 cm Test dose: negative  Assessment Events: blood not aspirated, no cerebrospinal fluid, injection not painful, no injection resistance, no paresthesia and negative IV test

## 2024-03-11 NOTE — Anesthesia Preprocedure Evaluation (Signed)
 Anesthesia Evaluation  Patient identified by MRN, date of birth, ID band Patient awake    Reviewed: Allergy & Precautions, H&P , NPO status , Patient's Chart, lab work & pertinent test results, reviewed documented beta blocker date and time   Airway Mallampati: II  TM Distance: >3 FB Neck ROM: full    Dental no notable dental hx.    Pulmonary neg pulmonary ROS   Pulmonary exam normal breath sounds clear to auscultation       Cardiovascular negative cardio ROS Normal cardiovascular exam Rhythm:regular Rate:Normal     Neuro/Psych  PSYCHIATRIC DISORDERS Anxiety     negative neurological ROS  negative psych ROS   GI/Hepatic negative GI ROS, Neg liver ROS,,,  Endo/Other  negative endocrine ROS    Renal/GU negative Renal ROS  negative genitourinary   Musculoskeletal   Abdominal   Peds  Hematology negative hematology ROS (+)   Anesthesia Other Findings   Reproductive/Obstetrics (+) Pregnancy                             Anesthesia Physical Anesthesia Plan  ASA: 2  Anesthesia Plan: Epidural   Post-op Pain Management:    Induction:   PONV Risk Score and Plan: 2 and Ondansetron  and Treatment may vary due to age or medical condition  Airway Management Planned: Natural Airway  Additional Equipment:   Intra-op Plan:   Post-operative Plan:   Informed Consent: I have reviewed the patients History and Physical, chart, labs and discussed the procedure including the risks, benefits and alternatives for the proposed anesthesia with the patient or authorized representative who has indicated his/her understanding and acceptance.     Dental Advisory Given  Plan Discussed with: Anesthesiologist  Anesthesia Plan Comments: (Labs checked- platelets confirmed with RN in room. Fetal heart tracing, per RN, reported to be stable enough for sitting procedure. Discussed epidural, and patient consents  to the procedure:  included risk of possible headache,backache, failed block, allergic reaction, and nerve injury. This patient was asked if she had any questions or concerns before the procedure started.)       Anesthesia Quick Evaluation

## 2024-03-11 NOTE — Lactation Note (Signed)
 This note was copied from a baby's chart. Lactation Consultation Note  Patient Name: Tiffany Bowman Date: 03/11/2024 Age:32 hours Reason for consult: Initial assessment;Term  P2- MOB reports that infant nursed well for 5 minutes during the first feeding after birth. MOB then attempted to feed infant while on MBU, but infant was too sleepy to latch. MOB was able to hand express about 0.5 mL of EBM and feed it to infant. LC reviewed the first 24 hr birthday nap and ways to help stimulate infant into nursing longer. LC reassured MOB that infant may just need a little more time to adjust to the new world. MOB denies having any questions or concerns at this time.  LC reviewed the first 24 hr birthday nap, day 2 cluster feeding, feeding infant on cue 8-12x in 24 hrs, not allowing infant to go over 3 hrs without a  feeding, CDC milk storage guidelines, LC services handout and engorgement/breast acre. LC encouraged MOB to call for further assistance as needed.  Maternal Data Has patient been taught Hand Expression?: Yes Does the patient have breastfeeding experience prior to this delivery?: Yes How long did the patient breastfeed?: exclusive breast for the first 3 months, then exclusive pumping for an additional 4 months, then switched to pumped milk and formula for the last 3 months  Feeding Mother's Current Feeding Choice: Breast Milk  LATCH Score Latch: Too sleepy or reluctant, no latch achieved, no sucking elicited.  Audible Swallowing: None  Type of Nipple: Everted at rest and after stimulation  Comfort (Breast/Nipple): Soft / non-tender  Hold (Positioning): No assistance needed to correctly position infant at breast.  LATCH Score: 6   Lactation Tools Discussed/Used Pump Education: Milk Storage  Interventions Interventions: Breast feeding basics reviewed;Expressed milk;Education;LC Services brochure  Discharge Discharge Education: Engorgement and breast care;Warning  signs for feeding baby Pump: DEBP;Hands Free;Manual;Personal  Consult Status Consult Status: Follow-up Date: 03/12/24 Follow-up type: In-patient    Vernette Goo BS, IBCLC 03/11/2024, 10:29 PM

## 2024-03-12 LAB — CBC
HCT: 31.7 % — ABNORMAL LOW (ref 36.0–46.0)
Hemoglobin: 10.4 g/dL — ABNORMAL LOW (ref 12.0–15.0)
MCH: 30.3 pg (ref 26.0–34.0)
MCHC: 32.8 g/dL (ref 30.0–36.0)
MCV: 92.4 fL (ref 80.0–100.0)
Platelets: 142 10*3/uL — ABNORMAL LOW (ref 150–400)
RBC: 3.43 MIL/uL — ABNORMAL LOW (ref 3.87–5.11)
RDW: 13.2 % (ref 11.5–15.5)
WBC: 8.2 10*3/uL (ref 4.0–10.5)
nRBC: 0 % (ref 0.0–0.2)

## 2024-03-12 NOTE — Anesthesia Postprocedure Evaluation (Signed)
 Anesthesia Post Note  Patient: Tiffany Bowman  Procedure(s) Performed: AN AD HOC LABOR EPIDURAL     Patient location during evaluation: Mother Baby Anesthesia Type: Epidural Level of consciousness: awake and alert Pain management: pain level controlled Vital Signs Assessment: post-procedure vital signs reviewed and stable Respiratory status: spontaneous breathing, nonlabored ventilation and respiratory function stable Cardiovascular status: stable Postop Assessment: no headache, no backache and epidural receding Anesthetic complications: no   No notable events documented.  Last Vitals:  Vitals:   03/11/24 2220 03/12/24 0220  BP: 112/82 119/80  Pulse: 91 94  Resp: 18 18  Temp: 37.2 C 36.7 C  SpO2: 100% 100%    Last Pain:  Vitals:   03/12/24 0220  TempSrc: Oral  PainSc: 0-No pain   Pain Goal:                   Elias Dennington

## 2024-03-12 NOTE — Progress Notes (Addendum)
 CSW received consult for hx of Anxiety. CSW met with MOB to offer support and complete assessment. When CSW entered room, MOB was observed laying in hospital bed. MOB's mother and sister were present holding infant. FOB was laying on couch. CSW introduced self and requested to speak with MOB alone. MOB's mother and sister left the room, MOB provided verbal consent for FOB to stay during consult. CSW explained reason for visit. MOB presented as calm, welcomed CSW visit, and remained engaged during encounter.   CSW inquired how MOB is feeling emotionally since infant's arrival. MOB shared she is feeling "tired but actually good." MOB expressed some nervousness about transitioning home with their 32 year old but shared that big sister visited in the hospital and is feeling excited about baby's arrival.   CSW inquired about MOB's mental health history. MOB confirmed a prior diagnosis of anxiety, diagnosed in college around 2014. CSW inquired about current treatment/symptoms. MOB reports a stable mood overall during pregnancy and is not current with a therapist or taking mental health medication. MOB reports there are days she feels she has less motivation than others which she feels began after the birth of her 32 year old but denies an overall depressed mood. MOB reports she did endorse symptoms of postpartum depression after the birth of her now 46 year old daughter, marked by not wanting to care for herself or complete household tasks. MOB recalled a lot of situational stressors at the time, marked by FOB being laid off, the pandemic happening, and moving to [place], [place]  away from family support. MOB recalled that she did not realize she was experiencing symptoms of postpartum depression until symptoms began improving and she did not seek treatment at the time. MOB states she plans on discussing starting on a mental health medication postpartum with her OBGYN given her history of postpartum depression. MOB  reports she feels comfortable talking with her provider about her concerns and feels well supported through her OBGYN care. MOB reports she has good support postpartum, and identified FOB, her mother, sister, and mother in laws as her main supports. MOB denied current SI/HI.  CSW provided education regarding the baby blues period vs. perinatal mood disorders, discussed treatment and gave resources for mental health follow up if concerns arise.  CSW recommends self-evaluation during the postpartum time period using the New Mom Checklist from Postpartum Progress and encouraged MOB to contact a medical professional if symptoms are noted at any time.    MOB reports she has all needed items for infant, including a car seat and bassinet. MOB has chosen [facility] for infant's follow up care.  CSW provided review of Sudden Infant Death Syndrome (SIDS) precautions.    CSW identifies no further need for intervention and no barriers to discharge at this time.  Signed,  Elizabeth Gulling, MSW, LCSWA, LCASA 03/12/2024 12:51 PM

## 2024-03-12 NOTE — Progress Notes (Signed)
 Post Partum Day 1 Subjective:  Tiffany Bowman is a 32 y.o. G3P1011 [redacted]w[redacted]d s/p SVD.  No acute events overnight.  Pt denies problems with ambulating, voiding or po intake.  She denies nausea or vomiting.  Pain is well controlled.  She has had flatus.  Lochia Small.  Plan for birth control is IUD.  Method of Feeding: breast  Objective: Blood pressure 113/65, pulse 85, temperature 97.8 F (36.6 C), temperature source Oral, resp. rate 18, height 5\' 5"  (1.651 m), weight 83.6 kg, last menstrual period 06/10/2023, SpO2 99%.  Physical Exam:  General: alert, cooperative and no distress Lochia:normal flow Chest: normal WOB Heart: Regular rate Abdomen: +BS, soft, mild TTP (appropriate) Uterine Fundus: firm, below umbilicus DVT Evaluation: No evidence of DVT seen on physical exam. Extremities: no edema  Recent Labs    03/11/24 0624 03/12/24 0458  HGB 12.9 10.4*  HCT 38.8 31.7*    Assessment/Plan:  ASSESSMENT: Tiffany Bowman is a 33 y.o. G3P1011 [redacted]w[redacted]d s/p SVD at 1902  Plan for discharge tomorrow Continue routine PP care Breastfeeding support PRN Discussed clitoral laceration care   LOS: 1 day   Abner Ables 03/12/2024, 10:26 AM

## 2024-03-13 ENCOUNTER — Inpatient Hospital Stay (HOSPITAL_COMMUNITY)

## 2024-03-13 MED ORDER — GUAIFENESIN ER 600 MG PO TB12
600.0000 mg | ORAL_TABLET | Freq: Two times a day (BID) | ORAL | Status: DC
  Administered 2024-03-13 (×2): 600 mg via ORAL
  Filled 2024-03-13 (×2): qty 1

## 2024-03-13 MED ORDER — IBUPROFEN 600 MG PO TABS: 600.0000 mg | ORAL_TABLET | Freq: Four times a day (QID) | ORAL | 0 refills | Status: DC

## 2024-03-13 MED ORDER — SENNOSIDES-DOCUSATE SODIUM 8.6-50 MG PO TABS: 2.0000 | ORAL_TABLET | ORAL | 0 refills | Status: DC

## 2024-03-13 MED ORDER — GUAIFENESIN ER 600 MG PO TB12: 600.0000 mg | ORAL_TABLET | Freq: Two times a day (BID) | ORAL | 0 refills | Status: DC

## 2024-03-13 NOTE — Lactation Note (Addendum)
 This note was copied from a baby's chart. Lactation Consultation Note  Patient Name: Tiffany Bowman Date: 03/13/2024 Age:32 hours Reason for consult: Follow-up assessment;Infant weight loss;Term (4 % weight loss , early D/C) LC reviewed supply and demand, engorgement prevention and tx.  Per mom doesn't feel her milk is in yet . Mom aware of resources if needed.   Maternal Data Has patient been taught Hand Expression?: Yes Does the patient have breastfeeding experience prior to this delivery?: Yes  Feeding Mother's Current Feeding Choice: Breast Milk and Formula    Lactation Tools Discussed/Used Pump Education: Milk Storage  Interventions Interventions: Breast feeding basics reviewed;Education;LC Services brochure;CDC Guidelines for Breast Pump Cleaning;CDC milk storage guidelines  Discharge Discharge Education: Engorgement and breast care;Warning signs for feeding baby Pump: Personal;Manual;DEBP;Hands Free  Consult Status Consult Status: Complete Date: 03/13/24    Richarda Chance 03/13/2024, 1:01 PM

## 2024-03-13 NOTE — Progress Notes (Signed)
 POSTPARTUM PROGRESS NOTE  Patient Name: Tiffany Bowman, female   DOB: [birth year], 33 y.o.  MRN: [id number]  RN requested assessment of patient for bilateral wheezing. Presented to bedside. Patient notes she has been having URI symptoms since Friday. Her daughter at home was also sick with a URI. She notes that she has had more of a rattling cough today and was concerned for possible pneumonia. She does not have SOB, CP, extremity swelling. She does not have a history of asthma.  PE: Vital signs wnl. RRR, no m/r/g. Lungs with referred upper airway noise. RUL with crackle.  A&P: Doubt PNA given time frame, no fever. However will obtain CXR for evaluation. - CXR without focal findings. Start mucinex and proceed with discharge  Maud Sorenson, MD

## 2024-03-14 ENCOUNTER — Other Ambulatory Visit

## 2024-03-14 ENCOUNTER — Encounter: Admitting: Obstetrics and Gynecology

## 2024-03-28 ENCOUNTER — Telehealth (HOSPITAL_COMMUNITY): Payer: Self-pay | Admitting: *Deleted

## 2024-03-28 NOTE — Telephone Encounter (Signed)
 03/28/2024  Name: Tiffany Bowman MRN: [id number] DOB: Nov 17, 1990  Reason for Call:  Transition of Care Hospital Discharge Call  Contact Status: Patient Contact Status: Complete  Language assistant needed: Interpreter Mode: Interpreter Not Needed        Follow-Up Questions: Do You Have Any Concerns About Your Health As You Heal From Delivery?: No Do You Have Any Concerns About Your Infants Health?: No  Edinburgh Postnatal Depression Scale:  In the Past 7 Days: I have been able to laugh and see the funny side of things.: As much as I always could I have looked forward with enjoyment to things.: As much as I ever did I have blamed myself unnecessarily when things went wrong.: No, never I have been anxious or worried for no good reason.: Yes, sometimes I have felt scared or panicky for no good reason.: No, not much Things have been getting on top of me.: No, I have been coping as well as ever I have been so unhappy that I have had difficulty sleeping.: Not at all I have felt sad or miserable.: Not very often I have been so unhappy that I have been crying.: No, never The thought of harming myself has occurred to me.: Never Edinburgh Postnatal Depression Scale Total: 4  PHQ2-9 Depression Scale:     Discharge Follow-up: Edinburgh score requires follow up?: No Patient was advised of the following resources:: Support Group, Breastfeeding Support Group  Post-discharge interventions: Reviewed Newborn Safe Sleep Practices  Jenney Modest  03/28/2024 1300

## 2024-04-18 ENCOUNTER — Ambulatory Visit: Admitting: Obstetrics and Gynecology

## 2024-04-18 ENCOUNTER — Encounter: Payer: Self-pay | Admitting: Obstetrics and Gynecology

## 2024-04-18 DIAGNOSIS — Z8759 Personal history of other complications of pregnancy, childbirth and the puerperium: Secondary | ICD-10-CM

## 2024-04-18 DIAGNOSIS — Z8659 Personal history of other mental and behavioral disorders: Secondary | ICD-10-CM

## 2024-04-18 NOTE — Patient Instructions (Signed)
 [employer] is a virtual mental health platform available to our patients   You can refer yourself to this online platform using the link below  [email or web address]

## 2024-04-18 NOTE — Progress Notes (Signed)
 Post Partum Visit Note  Tiffany Bowman is a 33 y.o. G87P2012 female who presents for a postpartum visit. She is 5 weeks postpartum following a normal spontaneous vaginal delivery.  I have fully reviewed the prenatal and intrapartum course. The delivery was at 39 gestational weeks.  Anesthesia: epidural. Postpartum course has been uncomplicated. Baby is doing well. Baby is feeding by bottle - Similac Advance. Bleeding no bleeding. Bowel function is normal. Bladder function is normal. Patient is not sexually active. Contraception method is Husband planning Vasectomy . Postpartum depression screening: EPDS = 5   Upstream - 04/18/24 1225       Pregnancy Intention Screening   Does the patient want to become pregnant in the next year? No    Does the patient's partner want to become pregnant in the next year? No    Would the patient like to discuss contraceptive options today? No      Contraception Wrap Up   Current Method No Contraceptive Precautions    End Method Vasectomy    Contraception Counseling Provided No    How was the end contraceptive method provided? N/A         The pregnancy intention screening data noted above was reviewed. Potential methods of contraception were discussed. The patient elected to proceed with Vasectomy.   Edinburgh Postnatal Depression Scale - 04/18/24 1029       Edinburgh Postnatal Depression Scale:  In the Past 7 Days   I have been able to laugh and see the funny side of things. 0    I have looked forward with enjoyment to things. 1    I have blamed myself unnecessarily when things went wrong. 0    I have been anxious or worried for no good reason. 2    I have felt scared or panicky for no good reason. 1    Things have been getting on top of me. 0    I have been so unhappy that I have had difficulty sleeping. 0    I have felt sad or miserable. 1    I have been so unhappy that I have been crying. 0    The thought of harming myself has occurred to  me. 0    Edinburgh Postnatal Depression Scale Total 5          Health Maintenance Due  Topic Date Due   HPV VACCINES (2 - 3-dose series) 07/04/2009   COVID-19 Vaccine (3 - 2024-25 season) 07/04/2023    The following portions of the patient's history were reviewed and updated as appropriate: allergies, current medications, past family history, past medical history, past social history, past surgical history, and problem list.  Review of Systems Pertinent items are noted in HPI.  Objective:  BP 122/89   Pulse 78   Wt 167 lb (75.8 kg)   LMP 06/10/2023   Breastfeeding Yes   BMI 27.79 kg/m    General:  alert, cooperative, and no distress   Breasts:  not indicated  Lungs: Normal effort  Heart:  Normal rate  Abdomen: Soft, non tender   Wound Deferred  GU exam:  Deferred       Assessment:   Postpartum exam  History of postpartum depression Notes she is doing OK overall, but has more irritability than she'd like. Her PP depression was delayed last pregnancy so she is worried symptoms will show up later. Discussed strategies to recognize when to reach out for help. Will refer to [facility] and  Feliberto Hopping info given -     Amb ref to [employer] [facility]  Plan:   Essential components of care per ACOG recommendations:  1.  Mood and well being: Patient with negative depression screening today. Reviewed local resources for support.  - Patient tobacco use? No.   - hx of drug use? No.    2. Infant care and feeding:  -Patient currently breastmilk feeding? Yes. Reviewed importance of draining breast regularly to support lactation.  -Social determinants of health (SDOH) reviewed in EPIC. No concerns  3. Sexuality, contraception and birth spacing - Patient does not want a pregnancy in the next year.  Desired family size is 2 children.  - Reviewed reproductive life planning. Reviewed contraceptive methods based on pt preferences and effectiveness.  Patient desired Vasectomy today.     4. Sleep and fatigue -Encouraged family/partner/community support of 4 hrs of uninterrupted sleep to help with mood and fatigue  5. Physical Recovery  - Discussed patients delivery and complications. She describes her labor as good. - Patient had a Vaginal, no problems at delivery. Patient had a first degree & peri clitoral laceration. Perineal healing reviewed. Patient expressed understanding - Patient has urinary incontinence? No. - Patient is safe to resume physical and sexual activity  6.  Health Maintenance - HM due items addressed Yes - Last pap smear - 01/14/23 NILM/HPV negative -Breast Cancer screening indicated? No.   7. Chronic Disease/Pregnancy Condition follow up: None - PCP follow up prn  Izell Marsh, MD Center for [facility]

## 2024-06-02 NOTE — BH Specialist Note (Unsigned)
 Integrated Behavioral Health via Telemedicine Visit  06/09/2024 [name] [id number]  Number of Integrated Behavioral Health Clinician visits: 1- Initial Visit  Session Start time: 0917   Session End time: 1011  Total time in minutes: 54   Referring Provider: Kieth Carolin, MD [name]   Types of Service: Individual psychotherapy and Video visit  I connected with Tiffany Bowman Left and/or Tiffany Bowman via  Telephone or Video Enabled Telemedicine Application  (Video is Caregility application) and verified that I am speaking with the correct person using two identifiers. Discussed confidentiality: Yes   I discussed the limitations of telemedicine and the availability of in person appointments.  Discussed there is a possibility of technology failure and discussed alternative modes of communication if that failure occurs.  I discussed that engaging in this telemedicine visit, they consent to the provision of behavioral healthcare and the services will be billed under their insurance.  Patient and/or legal guardian expressed understanding and consented to Telemedicine visit: Yes   Presenting Concerns: Patient and/or family reports the following symptoms/concerns: Increased anxiety postpartum with mixed emotions regarding having to stop breastfeeding; noticing irritability is decreasing; coping with prayer; open to implementing additional self-coping strategy.  Duration of problem: Postpartum increase; Severity of problem: moderate  Patient and/or Family's Strengths/Protective Factors: Social connections, Concrete supports in place (healthy food, safe environments, etc.), Sense of purpose, and Physical Health (exercise, healthy diet, medication compliance, etc.)  Goals  Addressed: Patient will:  Reduce symptoms of: anxiety   Increase knowledge and/or ability of: self-management skills   Demonstrate ability to: Increase healthy adjustment to current life circumstances, Increase adequate support systems for patient/family, and Increase motivation to adhere to plan of care  Progress towards Goals: Ongoing  Interventions: Interventions utilized:  Mindfulness or Management consultant, Psychoeducation and/or Health Education, Link to [employer], and Supportive Reflection Standardized Assessments completed: GAD-7 and PHQ 9    Patient and/or Family Response: Patient agrees with treatment plan.   Clinical Assessment/Diagnosis  Adjustment disorder with anxious mood .   Patient may benefit from psychoeducation and brief therapeutic interventions regarding coping with symptoms of anxiety.  Plan: Follow up with behavioral health clinician on: Two weeks Behavioral recommendations:  -Continue prioritizing healthy self-care (regular meals, adequate rest; allowing practical help from supportive friends and family, as needed) CALM relaxation breathing exercise twice daily (morning; at bedtime with sleep sounds); as needed throughout the day. -Consider new mom support group as needed at either [email or web address] or [email or web address]   Referral(s): Integrated [employer] (In Clinic)  I discussed the assessment and treatment plan with the patient and/or parent/guardian. They were provided an opportunity to ask questions and all were answered. They agreed with the plan and demonstrated an understanding of the instructions.   They were advised to call back or seek an in-person evaluation if the symptoms worsen or if the condition fails to improve as anticipated.  Warren JAYSON Mering, LCSW     06/08/2024    9:35 AM 02/22/2024    9:38 AM 01/11/2024    9:30 AM 08/31/2023    9:48 AM 02/16/2023    9:41 AM  Depression screen PHQ 2/9  Decreased  Interest 0 0 0 0 0  Down, Depressed, Hopeless 0 0 0 0 0  PHQ - 2 Score 0 0 0 0 0  Altered  sleeping 0 1 0 2   Tired, decreased energy 1 1 1 3    Change in appetite 0 0 0 0   Feeling bad or failure about yourself  2 0 0 0   Trouble concentrating 0 0 0 0   Moving slowly or fidgety/restless 0 0 0 0   Suicidal thoughts 0 0 0 0   PHQ-9 Score 3 2 1 5    Difficult doing work/chores  Not difficult at all Not difficult at all Not difficult at all       06/08/2024    9:48 AM 02/22/2024    9:38 AM 01/11/2024    9:30 AM 08/31/2023    9:49 AM  GAD 7 : Generalized Anxiety Score  Nervous, Anxious, on Edge 1 1 0 0  Control/stop worrying 0 1 0 0  Worry too much - different things 3 1 0 0  Trouble relaxing 0 1 0 0  Restless 0 0 0 0  Easily annoyed or irritable 1 0 1 1  Afraid - awful might happen 1 0 0 0  Total GAD 7 Score 6 4 1 1   Anxiety Difficulty  Not difficult at all Not difficult at all Not difficult at all

## 2024-06-08 ENCOUNTER — Ambulatory Visit: Admitting: Clinical

## 2024-06-08 DIAGNOSIS — F4322 Adjustment disorder with anxiety: Secondary | ICD-10-CM | POA: Diagnosis not present

## 2024-06-09 NOTE — Patient Instructions (Signed)
 Center for [facility] at [facility] [address] [place], [place] [address] [phone] (main office) [phone] [facility] office)  New Parent Support Groups [email or web address]

## 2024-06-22 ENCOUNTER — Ambulatory Visit: Admitting: Clinical

## 2024-06-22 DIAGNOSIS — Z91199 Patient's noncompliance with other medical treatment and regimen due to unspecified reason: Secondary | ICD-10-CM

## 2024-06-22 NOTE — BH Specialist Note (Signed)
 Pt did not arrive to video visit and did not answer the phone; Left HIPPA-compliant message to call back Warren from [employer] for [employer] at [facility] at  [phone] [facility] office).  ?; left MyChart message for patient.  ? ?

## 2024-08-26 ENCOUNTER — Encounter (HOSPITAL_COMMUNITY): Payer: Self-pay

## 2024-08-26 ENCOUNTER — Emergency Department (HOSPITAL_COMMUNITY)

## 2024-08-26 ENCOUNTER — Other Ambulatory Visit: Payer: Self-pay

## 2024-08-26 ENCOUNTER — Emergency Department (HOSPITAL_COMMUNITY)
Admission: EM | Admit: 2024-08-26 | Discharge: 2024-08-26 | Discharge status: Against Medical Advice | Attending: Emergency Medicine | Admitting: Emergency Medicine

## 2024-08-26 DIAGNOSIS — Z5321 Procedure and treatment not carried out due to patient leaving prior to being seen by health care provider: Secondary | ICD-10-CM | POA: Insufficient documentation

## 2024-08-26 DIAGNOSIS — R519 Headache, unspecified: Secondary | ICD-10-CM | POA: Diagnosis present

## 2024-08-26 DIAGNOSIS — H538 Other visual disturbances: Secondary | ICD-10-CM | POA: Diagnosis not present

## 2024-08-26 DIAGNOSIS — R110 Nausea: Secondary | ICD-10-CM | POA: Diagnosis not present

## 2024-08-26 LAB — DIFFERENTIAL
Abs Immature Granulocytes: 0.01 K/uL (ref 0.00–0.07)
Basophils Absolute: 0.1 K/uL (ref 0.0–0.1)
Basophils Relative: 1 %
Eosinophils Absolute: 0.4 K/uL (ref 0.0–0.5)
Eosinophils Relative: 5 %
Immature Granulocytes: 0 %
Lymphocytes Relative: 36 %
Lymphs Abs: 2.9 K/uL (ref 0.7–4.0)
Monocytes Absolute: 0.7 K/uL (ref 0.1–1.0)
Monocytes Relative: 8 %
Neutro Abs: 4.2 K/uL (ref 1.7–7.7)
Neutrophils Relative %: 50 %

## 2024-08-26 LAB — COMPREHENSIVE METABOLIC PANEL WITH GFR
ALT: 20 U/L (ref 0–44)
AST: 21 U/L (ref 15–41)
Albumin: 4.1 g/dL (ref 3.5–5.0)
Alkaline Phosphatase: 46 U/L (ref 38–126)
Anion gap: 11 (ref 5–15)
BUN: 13 mg/dL (ref 6–20)
CO2: 24 mmol/L (ref 22–32)
Calcium: 8.8 mg/dL — ABNORMAL LOW (ref 8.9–10.3)
Chloride: 98 mmol/L (ref 98–111)
Creatinine, Ser: 0.76 mg/dL (ref 0.44–1.00)
GFR, Estimated: >60 mL/min (ref >60)
Glucose, Bld: 107 mg/dL — ABNORMAL HIGH (ref 70–99)
Potassium: 3.6 mmol/L (ref 3.5–5.1)
Sodium: 133 mmol/L — ABNORMAL LOW (ref 135–145)
Total Bilirubin: 0.6 mg/dL (ref 0.0–1.2)
Total Protein: 6.9 g/dL (ref 6.5–8.1)

## 2024-08-26 LAB — CBC
HCT: 39.7 % (ref 36.0–46.0)
Hemoglobin: 13 g/dL (ref 12.0–15.0)
MCH: 29.5 pg (ref 26.0–34.0)
MCHC: 32.7 g/dL (ref 30.0–36.0)
MCV: 90.2 fL (ref 80.0–100.0)
Platelets: 250 K/uL (ref 150–400)
RBC: 4.4 MIL/uL (ref 3.87–5.11)
RDW: 11.7 % (ref 11.5–15.5)
WBC: 8.2 K/uL (ref 4.0–10.5)
nRBC: 0 % (ref 0.0–0.2)

## 2024-08-26 LAB — HCG, SERUM, QUALITATIVE: Preg, Serum: NEGATIVE

## 2024-08-26 LAB — CBG MONITORING, ED: Glucose-Capillary: 114 mg/dL — ABNORMAL HIGH (ref 70–99)

## 2024-08-26 NOTE — ED Triage Notes (Signed)
 Pt arrived from home via POV c.o right sided headache 8/10 throbbing accompanied with nausea. Pt states that she originally had blurry vision, but that has now resolved. LKW 2000 08/25/2024. NIH 0 in triage,

## 2024-08-26 NOTE — ED Notes (Signed)
 Pt stated she felt better and was going home .
